# Patient Record
Sex: Female | Born: 1965 | Race: Asian | Hispanic: No | Marital: Married | Smoking: Never smoker
Health system: Southern US, Community
[De-identification: ages and names within clinical notes are randomized; demographics above are authoritative.]

---

## 2007-12-15 ENCOUNTER — Ambulatory Visit: Payer: Self-pay | Admitting: Internal Medicine

## 2007-12-15 DIAGNOSIS — R109 Unspecified abdominal pain: Secondary | ICD-10-CM | POA: Insufficient documentation

## 2008-03-14 ENCOUNTER — Ambulatory Visit: Payer: Self-pay | Admitting: Internal Medicine

## 2008-03-14 DIAGNOSIS — K219 Gastro-esophageal reflux disease without esophagitis: Secondary | ICD-10-CM | POA: Insufficient documentation

## 2008-12-12 ENCOUNTER — Other Ambulatory Visit: Admission: RE | Admit: 2008-12-12 | Discharge: 2008-12-12 | Payer: Self-pay | Admitting: Family Medicine

## 2010-07-23 ENCOUNTER — Other Ambulatory Visit (HOSPITAL_COMMUNITY)
Admission: RE | Admit: 2010-07-23 | Discharge: 2010-07-23 | Disposition: A | Discharge status: Home / Self Care | Payer: BC Managed Care – PPO | Source: Ambulatory Visit | Attending: Family Medicine | Admitting: Family Medicine

## 2010-07-23 DIAGNOSIS — Z124 Encounter for screening for malignant neoplasm of cervix: Secondary | ICD-10-CM | POA: Insufficient documentation

## 2012-12-23 ENCOUNTER — Other Ambulatory Visit: Payer: Self-pay

## 2012-12-23 DIAGNOSIS — Z1231 Encounter for screening mammogram for malignant neoplasm of breast: Secondary | ICD-10-CM

## 2013-01-20 ENCOUNTER — Ambulatory Visit
Admission: RE | Admit: 2013-01-20 | Discharge: 2013-01-20 | Disposition: A | Discharge status: Home / Self Care | Payer: Managed Care, Other (non HMO) | Source: Ambulatory Visit

## 2013-01-20 DIAGNOSIS — Z1231 Encounter for screening mammogram for malignant neoplasm of breast: Secondary | ICD-10-CM

## 2013-10-04 ENCOUNTER — Other Ambulatory Visit (HOSPITAL_COMMUNITY)
Admission: RE | Admit: 2013-10-04 | Discharge: 2013-10-04 | Disposition: A | Discharge status: Home / Self Care | Payer: BC Managed Care – PPO | Source: Ambulatory Visit | Attending: Family Medicine | Admitting: Family Medicine

## 2013-10-04 ENCOUNTER — Other Ambulatory Visit: Payer: Self-pay | Admitting: Family Medicine

## 2013-10-04 DIAGNOSIS — Z124 Encounter for screening for malignant neoplasm of cervix: Secondary | ICD-10-CM | POA: Insufficient documentation

## 2013-10-05 LAB — CYTOLOGY - PAP

## 2013-10-20 ENCOUNTER — Other Ambulatory Visit: Payer: Self-pay | Admitting: Obstetrics & Gynecology

## 2015-01-02 ENCOUNTER — Telehealth: Payer: Self-pay | Admitting: Hematology

## 2015-01-02 NOTE — Telephone Encounter (Signed)
CALLED PT LEFT VM IN REF TO NP APPT. °

## 2015-01-13 ENCOUNTER — Telehealth: Payer: Self-pay | Admitting: Oncology

## 2015-01-13 NOTE — Telephone Encounter (Signed)
Spoke with pt regarding new pt referral pt will call back to schedule

## 2015-11-22 ENCOUNTER — Other Ambulatory Visit: Payer: Self-pay | Admitting: Family Medicine

## 2015-11-22 DIAGNOSIS — Z1231 Encounter for screening mammogram for malignant neoplasm of breast: Secondary | ICD-10-CM

## 2015-12-13 ENCOUNTER — Ambulatory Visit: Payer: Managed Care, Other (non HMO)

## 2016-01-16 ENCOUNTER — Ambulatory Visit
Admission: RE | Admit: 2016-01-16 | Discharge: 2016-01-16 | Disposition: A | Discharge status: Home / Self Care | Payer: BLUE CROSS/BLUE SHIELD | Source: Ambulatory Visit | Attending: Family Medicine | Admitting: Family Medicine

## 2016-01-16 DIAGNOSIS — Z1231 Encounter for screening mammogram for malignant neoplasm of breast: Secondary | ICD-10-CM

## 2016-01-17 ENCOUNTER — Other Ambulatory Visit: Payer: Self-pay | Admitting: Family Medicine

## 2016-01-17 DIAGNOSIS — R928 Other abnormal and inconclusive findings on diagnostic imaging of breast: Secondary | ICD-10-CM

## 2016-01-24 ENCOUNTER — Ambulatory Visit
Admission: RE | Admit: 2016-01-24 | Discharge: 2016-01-24 | Disposition: A | Discharge status: Home / Self Care | Payer: BLUE CROSS/BLUE SHIELD | Source: Ambulatory Visit | Attending: Family Medicine | Admitting: Family Medicine

## 2016-01-24 ENCOUNTER — Other Ambulatory Visit: Payer: Self-pay | Admitting: Family Medicine

## 2016-01-24 DIAGNOSIS — R928 Other abnormal and inconclusive findings on diagnostic imaging of breast: Secondary | ICD-10-CM

## 2016-01-24 DIAGNOSIS — R599 Enlarged lymph nodes, unspecified: Secondary | ICD-10-CM

## 2016-01-24 DIAGNOSIS — N6489 Other specified disorders of breast: Secondary | ICD-10-CM

## 2016-01-30 ENCOUNTER — Other Ambulatory Visit: Payer: Self-pay | Admitting: Family Medicine

## 2016-01-30 DIAGNOSIS — R599 Enlarged lymph nodes, unspecified: Secondary | ICD-10-CM

## 2016-01-31 ENCOUNTER — Ambulatory Visit
Admission: RE | Admit: 2016-01-31 | Discharge: 2016-01-31 | Disposition: A | Discharge status: Home / Self Care | Payer: BLUE CROSS/BLUE SHIELD | Source: Ambulatory Visit | Attending: Family Medicine | Admitting: Family Medicine

## 2016-01-31 ENCOUNTER — Other Ambulatory Visit: Payer: Self-pay | Admitting: Family Medicine

## 2016-01-31 DIAGNOSIS — N6489 Other specified disorders of breast: Secondary | ICD-10-CM

## 2016-01-31 DIAGNOSIS — R599 Enlarged lymph nodes, unspecified: Secondary | ICD-10-CM

## 2016-02-02 ENCOUNTER — Inpatient Hospital Stay: Admission: RE | Admit: 2016-02-02 | Payer: BLUE CROSS/BLUE SHIELD | Source: Ambulatory Visit

## 2016-02-06 ENCOUNTER — Ambulatory Visit
Admission: RE | Admit: 2016-02-06 | Discharge: 2016-02-06 | Disposition: A | Discharge status: Home / Self Care | Payer: BLUE CROSS/BLUE SHIELD | Source: Ambulatory Visit | Attending: Family Medicine | Admitting: Family Medicine

## 2016-02-06 DIAGNOSIS — R599 Enlarged lymph nodes, unspecified: Secondary | ICD-10-CM

## 2016-02-14 ENCOUNTER — Other Ambulatory Visit: Payer: Self-pay | Admitting: Surgery

## 2016-02-15 ENCOUNTER — Other Ambulatory Visit: Payer: Self-pay | Admitting: Surgery

## 2016-02-15 DIAGNOSIS — C50919 Malignant neoplasm of unspecified site of unspecified female breast: Secondary | ICD-10-CM

## 2016-02-16 ENCOUNTER — Telehealth: Payer: Self-pay | Admitting: Oncology

## 2016-02-16 ENCOUNTER — Encounter: Payer: Self-pay | Admitting: Oncology

## 2016-02-16 NOTE — Telephone Encounter (Signed)
Pt cld to schedule an appt w/Dr. Jana Hakim for 1/15 at 4pm. Pt agreed to the appt date and time. Demographics verified. Letter mailed.

## 2016-02-18 ENCOUNTER — Ambulatory Visit
Admission: RE | Admit: 2016-02-18 | Discharge: 2016-02-18 | Disposition: A | Discharge status: Home / Self Care | Payer: BLUE CROSS/BLUE SHIELD | Source: Ambulatory Visit | Attending: Surgery | Admitting: Surgery

## 2016-02-18 ENCOUNTER — Encounter: Payer: Self-pay | Admitting: Radiology

## 2016-02-18 DIAGNOSIS — C50919 Malignant neoplasm of unspecified site of unspecified female breast: Secondary | ICD-10-CM

## 2016-02-18 MED ORDER — GADOBENATE DIMEGLUMINE 529 MG/ML IV SOLN
14.0000 mL | Freq: Once | INTRAVENOUS | Status: AC | PRN
  Administered 2016-02-18: 14 mL via INTRAVENOUS

## 2016-02-21 ENCOUNTER — Other Ambulatory Visit: Payer: Self-pay | Admitting: Surgery

## 2016-02-21 DIAGNOSIS — R928 Other abnormal and inconclusive findings on diagnostic imaging of breast: Secondary | ICD-10-CM

## 2016-02-22 ENCOUNTER — Telehealth: Payer: Self-pay | Admitting: *Deleted

## 2016-02-22 NOTE — Telephone Encounter (Signed)
error 

## 2016-02-22 NOTE — Telephone Encounter (Signed)
Received appt date/time from Andrea.  Mailed new pt packet to pt.  

## 2016-02-23 ENCOUNTER — Other Ambulatory Visit: Payer: Self-pay | Admitting: *Deleted

## 2016-02-23 DIAGNOSIS — C50919 Malignant neoplasm of unspecified site of unspecified female breast: Secondary | ICD-10-CM

## 2016-02-26 ENCOUNTER — Other Ambulatory Visit: Payer: Self-pay | Admitting: *Deleted

## 2016-02-26 ENCOUNTER — Encounter: Payer: Self-pay | Admitting: *Deleted

## 2016-02-26 ENCOUNTER — Other Ambulatory Visit (HOSPITAL_BASED_OUTPATIENT_CLINIC_OR_DEPARTMENT_OTHER): Payer: BLUE CROSS/BLUE SHIELD

## 2016-02-26 ENCOUNTER — Ambulatory Visit (HOSPITAL_BASED_OUTPATIENT_CLINIC_OR_DEPARTMENT_OTHER): Payer: BLUE CROSS/BLUE SHIELD | Admitting: Oncology

## 2016-02-26 DIAGNOSIS — C50512 Malignant neoplasm of lower-outer quadrant of left female breast: Secondary | ICD-10-CM

## 2016-02-26 DIAGNOSIS — C50912 Malignant neoplasm of unspecified site of left female breast: Secondary | ICD-10-CM | POA: Insufficient documentation

## 2016-02-26 DIAGNOSIS — Z170 Estrogen receptor positive status [ER+]: Secondary | ICD-10-CM

## 2016-02-26 DIAGNOSIS — C50919 Malignant neoplasm of unspecified site of unspecified female breast: Secondary | ICD-10-CM

## 2016-02-26 DIAGNOSIS — C773 Secondary and unspecified malignant neoplasm of axilla and upper limb lymph nodes: Secondary | ICD-10-CM

## 2016-02-26 LAB — COMPREHENSIVE METABOLIC PANEL
ALBUMIN: 4.1 g/dL (ref 3.5–5.0)
ALK PHOS: 89 U/L (ref 40–150)
ALT: 17 U/L (ref 0–55)
AST: 12 U/L (ref 5–34)
Anion Gap: 8 mEq/L (ref 3–11)
BILIRUBIN TOTAL: 0.54 mg/dL (ref 0.20–1.20)
BUN: 9.8 mg/dL (ref 7.0–26.0)
CO2: 29 mEq/L (ref 22–29)
CREATININE: 0.7 mg/dL (ref 0.6–1.1)
Calcium: 9.8 mg/dL (ref 8.4–10.4)
Chloride: 105 mEq/L (ref 98–109)
GLUCOSE: 105 mg/dL (ref 70–140)
Potassium: 4.7 mEq/L (ref 3.5–5.1)
SODIUM: 141 meq/L (ref 136–145)
TOTAL PROTEIN: 7 g/dL (ref 6.4–8.3)

## 2016-02-26 LAB — CBC WITH DIFFERENTIAL/PLATELET
BASO%: 0.7 % (ref 0.0–2.0)
Basophils Absolute: 0 10*3/uL (ref 0.0–0.1)
EOS ABS: 0.1 10*3/uL (ref 0.0–0.5)
EOS%: 1.7 % (ref 0.0–7.0)
HCT: 42 % (ref 34.8–46.6)
HEMOGLOBIN: 13.8 g/dL (ref 11.6–15.9)
LYMPH%: 33.8 % (ref 14.0–49.7)
MCH: 29.1 pg (ref 25.1–34.0)
MCHC: 32.9 g/dL (ref 31.5–36.0)
MCV: 88.6 fL (ref 79.5–101.0)
MONO#: 0.4 10*3/uL (ref 0.1–0.9)
MONO%: 6.8 % (ref 0.0–14.0)
NEUT%: 57 % (ref 38.4–76.8)
NEUTROS ABS: 3.6 10*3/uL (ref 1.5–6.5)
RBC: 4.73 10*6/uL (ref 3.70–5.45)
RDW: 14.4 % (ref 11.2–14.5)
WBC: 6.3 10*3/uL (ref 3.9–10.3)
lymph#: 2.1 10*3/uL (ref 0.9–3.3)

## 2016-02-26 LAB — TECHNOLOGIST REVIEW

## 2016-02-26 NOTE — Progress Notes (Signed)
START ON PATHWAY REGIMEN - Breast  BOS235: Docetaxel  + Carboplatin + Trastuzumab + Pertuzumab (TCHP) x 6 Cycles then Continue Trastuzumab Maintenance q21 Days Post-Surgery x 11 Doses to Complete One Year of Therapy  Docetaxel  + Carboplatin + Trastuzumab + Pertuzumab (TCHP) q21 Days:   A cycle is every 21 days:     Pertuzumab (Perjeta(R)) 840 mg flat dose IV in 250 mL NS over 60 minutes as a loading dose cycle 1 only.  See observation note below. Dose Mod: None     Pertuzumab (Perjeta(R)) 420 mg flat dose IV in 250 mL NS over 30 minutes as maintenance dose cycles 2 and beyond. An observation period of 30-60 minutes is recommended after each dose prior to subsequent infusion of trastuzumab or docetaxel. Dose Mod: None     Trastuzumab (Herceptin(R)) 8 mg/kg in 250 mL NS IV over 90 minutes as a loading dose cycle 1 only. Dose Mod: None     Trastuzumab (Herceptin(R)) 6 mg/kg in 250 mL NS IV over 30 minutes as maintenance dose cycle 2 and beyond. Dose Mod: None     Carboplatin (Paraplatin(R)) AUC=6 in a total of 250 mL NS IV over 1 hour. Cited: TRYPHAENA trial gave carboplatin prior to docetaxel. Dose Mod: None     Docetaxel (Taxotere(R)) 75 mg/m2 in 250 mL NS IV over 1 hour.  Docetaxel should be given after pertuzumab and trastuzumab Dose Mod: None Additional Orders: Premedicate with dexamethasone 8 mg PO bid for three days beginning 1 day prior to docetaxel therapy. Recommended monitoring: LVEF baseline and q3-4 months or with the development of cardiac symptoms and regimen changes for metastatic treatment. Schneeweiss, et al. Lelon Frohlich Oncol. 2013 Sep;24(9):2278-84  **Always confirm dose/schedule in your pharmacy ordering system**    Trastuzumab (Maintenance - NO Loading Dose):   A cycle is every 21 days:     Trastuzumab (Herceptin(R)) 6 mg/kg in 250 mL NS IV over 90 minutes.  If tolerated, maintenance dose may be given over 30 mins. Recommended Monitoring: LVEF q3-4 months for adjuvant  treatment, or with the development of cardiac symptoms and regimen  changes for metastatic treatment. Dose Mod: None  **Always confirm dose/schedule in your pharmacy ordering system**    Patient Characteristics: Neoadjuvant Chemotherapy, HER2/neu Positive, ER Positive AJCC Stage Grouping: Unknown Current Disease Status: No Distant Mets or Local Recurrence AJCC M Stage: 0 ER Status: Positive (+) AJCC N Stage: 1a AJCC T Stage: X HER2/neu: Positive (+) PR Status: Negative (-)  Intent of Therapy: Curative Intent, Discussed with Patient

## 2016-02-26 NOTE — Progress Notes (Signed)
[facility]  [phone] [phone]     ID: Jacqueline Avery DOB: 12/02/1965  MR#: [id number]  CSN#:[id number]  Patient Care Team: Donna Gates, MD as PCP - General (Family Medicine) Douglas Blackman, MD as Consulting Physician (General Surgery)  C , MD as Consulting Physician (Oncology) , C, MD OTHER MD:  CHIEF COMPLAINT: HER-2 positive breast cancer  CURRENT TREATMENT: Neoadjuvant chemotherapy   BREAST CANCER HISTORY: Jacqueline Avery had bilateral screening mammography with tomography at [facility] 01/16/2016 showing an area of possible distortion in the left breast and possibly an enlarged left axillary lymph nodes. She was recalled for diagnostic mammography with tomography of the left breast and left breast ultrasonography 01/24/2016. The breast density was category C. In the central left breast there was an area of distortion and in the left axilla there was at least 1 lymph node with a thickened cortex. Ultrasonography found no correlate to the area of distortion in the left breast. A separate area of mixed echogenicity at 5:00 4 cm from the nipple was indeterminate. However ultrasound did show several enlarged and abnormal lymph nodes the largest measuring 1.6 cm.  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 The lymph node however was positive for metastatic carcinoma, positive for cytokeratin 7, gross cystic disease fluid protein, and e-cadherin, with very few estrogen receptor positive cells. ER was read as 5% positive with moderate staining intensity, progesterone receptor was negative, and the proliferation marker was 60%. HER-2 however was amplified the signals ratio being 5.46 and the number per cell 6.55.  On 02/18/2016 the patient underwent bilateral breast MRI, showing an indeterminate area of masslike enhancement in the upper outer left breast.  She is cheduled for his second MRI biopsy 02/27/2016.  Her subsequent history is as detailed below  INTERVAL HISTORY: Jacqueline Avery was evaluated in the breast clinic 02/26/2016 accompanied by her husband. Her case was also presented in the multidisciplinary breast cancer conference 02/14/2016. At that time the prognostic panel was not available and a definitive plan. Could not be proposed.  REVIEW OF SYSTEMS: There were no specific symptoms leading to the original mammogram, which was routinely scheduled. The patient denies unusual headaches, visual changes, nausea, vomiting, stiff neck, dizziness, or gait imbalance. There has been no cough, phlegm production, or pleurisy, no chest pain or pressure, and no change in bowel or bladder habits. The patient denies fever, rash, bleeding, unexplained fatigue or unexplained weight loss. Jacqueline Avery exercises regularly mostly by swimming, but she also is good at tenderness. She enjoys taking walks in good weather. A detailed review of systems was otherwise entirely negative.   PAST MEDICAL HISTORY: No past medical history on file.  PAST SURGICAL HISTORY: No past surgical history on file.  FAMILY HISTORY No family history on file.  The patient's father died from heart disease at the age of 77. The patient's mother is living as of January 2018, in her early 70s. The patient has one brother, 2 sisters. There is no history of breast or ovarian cancer in the family to the patient's knowledge  GYNECOLOGIC HISTORY:  Patient's last menstrual period was 12/16/2015. Menarche age 12, first live birth age 24, she is GX P2. Her periods are now irregular, her most recent one being October 2017. However she is not experiencing any menopausal symptoms.  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 He works in [place] however. One of   the patient's children is studying medicine at the [employer].    ADVANCED DIRECTIVES: Not in place   HEALTH  MAINTENANCE: Social History  Substance Use Topics  . Smoking status: Not on file  . Smokeless tobacco: Not on file  . Alcohol use Not on file     Colonoscopy: n/a  PAP:  Bone density: n/a   No Known Allergies  Current Outpatient Prescriptions  Medication Sig Dispense Refill  . b complex vitamins capsule Take 1 capsule by mouth daily.    . calcium carbonate (TUMS - DOSED IN MG ELEMENTAL CALCIUM) 500 MG chewable tablet Chew 1 tablet by mouth daily.    . cholecalciferol (VITAMIN D) 1000 units tablet Take 1,000 Units by mouth daily.    . TURMERIC PO Take by mouth.     No current facility-administered medications for this visit.     OBJECTIVE: Middle-aged East Asian woman who appears well Vitals:   02/26/16 1604  BP: 127/68  Pulse: 63  Resp: 20  Temp: 97.4 F (36.3 C)     Body mass index is 25.15 kg/m.    ECOG FS:0 - Asymptomatic  Ocular: Sclerae unicteric, pupils equal, round and reactive to light Ear-nose-throat: Oropharynx clear and moist Lymphatic: No cervical or supraclavicular adenopathy Lungs no rales or rhonchi, good excursion bilaterally Heart regular rate and rhythm, no murmur appreciated Abd soft, nontender, positive bowel sounds MSK no focal spinal tenderness, no joint edema Neuro: non-focal, well-oriented, appropriate affect Breasts: The right breast is unremarkable. I do not palpate a mass in the left breast and I do not palpate any axillary adenopathy.   LAB RESULTS:  CMP     Component Value Date/Time   NA 141 02/26/2016 1552   K 4.7 02/26/2016 1552   CO2 29 02/26/2016 1552   GLUCOSE 105 02/26/2016 1552   BUN 9.8 02/26/2016 1552   CREATININE 0.7 02/26/2016 1552   CALCIUM 9.8 02/26/2016 1552   PROT 7.0 02/26/2016 1552   ALBUMIN 4.1 02/26/2016 1552   AST 12 02/26/2016 1552   ALT 17 02/26/2016 1552   ALKPHOS 89 02/26/2016 1552   BILITOT 0.54 02/26/2016 1552    INo results found for: SPEP, UPEP  Lab Results  Component Value Date   WBC 6.3  02/26/2016   NEUTROABS 3.6 02/26/2016   HGB 13.8 02/26/2016   HCT 42.0 02/26/2016   MCV 88.6 02/26/2016   PLT 123 Large & giant platelets (L) 02/26/2016      Chemistry      Component Value Date/Time   NA 141 02/26/2016 1552   K 4.7 02/26/2016 1552   CO2 29 02/26/2016 1552   BUN 9.8 02/26/2016 1552   CREATININE 0.7 02/26/2016 1552      Component Value Date/Time   CALCIUM 9.8 02/26/2016 1552   ALKPHOS 89 02/26/2016 1552   AST 12 02/26/2016 1552   ALT 17 02/26/2016 1552   BILITOT 0.54 02/26/2016 1552       No results found for: LABCA2  No components found for: LABCA125  No results for input(s): INR in the last 168 hours.  Urinalysis No results found for: COLORURINE, APPEARANCEUR, LABSPEC, PHURINE, GLUCOSEU, HGBUR, BILIRUBINUR, KETONESUR, PROTEINUR, UROBILINOGEN, NITRITE, LEUKOCYTESUR   STUDIES: Mr Breast Bilateral W Wo Contrast  Addendum Date: 02/21/2016   ADDENDUM REPORT: 02/21/2016 14:07 ADDENDUM: The patient's clinical history should state that biopsy of an enlarged left axillary lymph node was positive for metastatic carcinoma of breast or salivary primary origin. Electronically Signed   By:   Erin  Shaw M.D.   On: 02/21/2016 14:07   Result Date: 02/21/2016 CLINICAL DATA:  50-year-old female with biopsy of a left breast distortion revealing a complex sclerosing lesion and lobular carcinoma in situ. Biopsy of an enlarged left axillary lymph node was positive for metastatic carcinoma of breast or solitary primary origin. LABS:  No labs were performed at the imaging center today. EXAM: BILATERAL BREAST MRI WITH AND WITHOUT CONTRAST TECHNIQUE: Multiplanar, multisequence MR images of both breasts were obtained prior to and following the intravenous administration of 14 ml of MultiHance. THREE-DIMENSIONAL MR IMAGE RENDERING ON INDEPENDENT WORKSTATION: Three-dimensional MR images were rendered by post-processing of the original MR data on an independent workstation. The  three-dimensional MR images were interpreted, and findings are reported in the following complete MRI report for this study. Three dimensional images were evaluated at the independent DynaCad workstation COMPARISON:  Previous exam(s). FINDINGS: Breast composition: c. Heterogeneous fibroglandular tissue. Background parenchymal enhancement: Moderate. Right breast: No dominant mass or suspicious enhancement. There is a background of multiple bilateral foci of enhancement. Left breast: Artifact from the X shaped biopsy marker is seen within the lower, outer left breast on series 3, image 171. Non mass enhancement surrounds the biopsy site measuring 2.5 x 1.3 x 1.9 cm (TR x AP x CC). Within the posterior, upper, outer left breast, there is focal non mass enhancement measuring 8 x 6 x 5 mm with associated washout kinetics (series 6, image 132). There is a background of multiple bilateral foci of enhancement. Lymph nodes: Multiple abnormal left axillary lymph nodes including the biopsied 1.7 cm lymph node seen on series 5, image 67 containing biopsy marker artifact. No suspicious appearing right axillary lymph nodes. Ancillary findings:  None. IMPRESSION: 1. Indeterminate focal area of non mass enhancement within the posterior upper, outer left breast. 2. Non mass enhancement surrounds the core biopsy site demonstrating a complex sclerosing lesion and lobular carcinoma in situ within the inferior left breast. 3. Left axillary adenopathy consistent with the known biopsy proven metastatic axillary disease. RECOMMENDATION: 1. MRI guided biopsy of the focal non mass enhancement within the posterior upper, outer left breast. 2. Treatment plan for the known left axillary metastatic disease. Surgical excision of the left breast biopsy site demonstrating a complex sclerosing lesion and lobular carcinoma in situ. BI-RADS CATEGORY  4: Suspicious. Electronically Signed: By: Erin  Shaw M.D. On: 02/19/2016 17:55   Mm Radiologist Eval  And Mgmt  Result Date: 02/06/2016 CONSULTATION: Patient Name: Avery, Jacqueline X Patient DOB:   11/19/1965 Age: 50 y/o MRN: [id number] Patient's Current Pain Level: 1-2 Dear Dr. DONNA GATES ; Your patient, Mrs. Jacqueline Avery, returned today for consultation regarding her biopsy results. I initially discussed the results from her breast biopsy, which revealed a complex sclerosing lesion, benign pathology which is considered a high risk lesion warranting surgical excision. I then discussed the biopsy of her axillary lymph node, which revealed carcinoma thought to be either of breast or salivary gland origin. Pathology did mention weak estrogen receptor positivity, which would support a breast primary, as does the location in the left axilla. The patient and her family had numerous questions concerning the cancer diagnosis. These were all answered to the best of my ability. I told the patient that she would be seen by a breast surgeon, Dr. Blackman, next week. I explained that she would need the complex sclerosing lesion resected, but given the carcinoma in the left axillary lymph node, I also explained that she may benefit from   breast MRI, as a problem-solving study to evaluate for possible breast carcinoma, prior to her surgical excision of the complex sclerosing lesion. The patient was told that she would also be referred to an oncologist as part of her evaluation and subsequent treatment. Thank you for referring your patient to the [facility], Sincerely, Lasandra Beech, MD Electronically Signed   By: Lajean Manes M.D.   On: 02/06/2016 15:44   Mm Clip Placement Left  Result Date: 01/31/2016 CLINICAL DATA:  Status post tomosynthesis guided biopsy of a distortion within the inferior left breast and ultrasound-guided biopsy of an enlarged left axillary lymph node EXAM: 3D DIAGNOSTIC LEFT MAMMOGRAM POST ULTRASOUND AND TOMOSYNTHESIS GUIDED BIOPSY COMPARISON:  Previous exam(s). FINDINGS: 3D Mammographic images were obtained  following tomosynthesis guided biopsy of a distortion within the inferior left breast and ultrasound-guided biopsy of an enlarged left axillary lymph node. Post biopsy mammogram demonstrates the X shaped biopsy marker in the expected location along the posterior margin of the biopsy cavity within the inferior left breast. A spiral shaped biopsy marker projects over the expected location of the enlarged axillary lymph node. IMPRESSION: Appropriate marker position as above. Final Assessment: Post Procedure Mammograms for Marker Placement Electronically Signed   By: Pamelia Hoit M.D.   On: 01/31/2016 10:06   Mm Lt Breast Bx W Loc Dev 1st Lesion Image Bx Spec Stereo Guide  Result Date: 01/31/2016 CLINICAL DATA:  51 year old female with indeterminate left breast distortion EXAM: LEFT BREAST STEREOTACTIC CORE NEEDLE BIOPSY COMPARISON:  Previous exams. FINDINGS: The patient and I discussed the procedure of tomosynthesis/stereotactic-guided biopsy including benefits and alternatives. We discussed the high likelihood of a successful procedure. We discussed the risks of the procedure including infection, bleeding, tissue injury, clip migration, and inadequate sampling. Informed written consent was given. The usual time out protocol was performed immediately prior to the procedure. Using sterile technique and 1% Lidocaine as local anesthetic, under stereotactic guidance, a 9 gauge vacuum assist device was used to perform core needle biopsy of a distortion in the inferior left breast using a lateral to medial approach. Specimen radiograph was performed. At the conclusion of the procedure, an X shaped tissue marker clip was deployed into the biopsy cavity. Follow-up 2-view mammogram was performed and dictated separately. IMPRESSION: Stereotactic/ tomosynthesis -guided biopsy of an indeterminate left breast distortion. No apparent complications. Electronically Signed   By: Pamelia Hoit M.D.   On: 01/31/2016 09:55   [place] Lt  Breast Bx W Loc Dev 1st Lesion Img Bx Spec US Guide  Result Date: 01/31/2016 CLINICAL DATA:  51 year old female for ultrasound-guided biopsy of an enlarged left axillary lymph node EXAM: ULTRASOUND GUIDED CORE NEEDLE BIOPSY OF A LEFT AXILLARY NODE COMPARISON:  Previous exam(s). FINDINGS: I met with the patient and we discussed the procedure of ultrasound-guided biopsy, including benefits and alternatives. We discussed the high likelihood of a successful procedure. We discussed the risks of the procedure, including infection, bleeding, tissue injury, clip migration, and inadequate sampling. Informed written consent was given. The usual time-out protocol was performed immediately prior to the procedure. Using sterile technique and 1% Lidocaine as local anesthetic, under direct ultrasound visualization, a 14 gauge spring-loaded device was used to perform biopsy of an enlarged left axillary lymph node using a lateral to medial approach. At the conclusion of the procedure a spiral shaped tissue marker clip was deployed into the biopsy cavity. Follow up 2 view mammogram was performed and dictated separately. IMPRESSION: Ultrasound guided biopsy of an enlarged  left axillary lymph node. No apparent complications. Electronically Signed   By: Pamelia Hoit M.D.   On: 01/31/2016 09:52    ELIGIBLE FOR AVAILABLE RESEARCH PROTOCOL: no  ASSESSMENT: 51 y.o. [place] woman status post left axillary lymph node biopsy 01/31/2016 showing invasive ductal carcinoma, high-grade, E-cadherin positive, weakly estrogen receptor positive at 5%, progesterone receptor negative, with an MIB-1 of 60%, but HER-2 amplified, the signals ratio being 5.46 and the number per cell 6.55.  (a) biopsy of a left breast lower outer quadrant irregularity 01/31/2016 showed a sclerosing lesion and LCIS  (b) biopsy of a left breast upper outer quadrant irregularity pending 02/27/2016  (1) neoadjuvant chemotherapy will consist of carboplatin, docetaxel,  trastuzumab and pertuzumab given every 21 days for 6 cycles  (2) trastuzumab and pertuzumab to be continued to complete a year  (3) definitive surgery at the completion of chemotherapy  (4) adjuvant radiation to follow as appropriate  (5) consider anti-estrogens at the completion of local therapy   PLAN: We spent the better part of today's hour-long appointment discussing the biology of breast cancer in general, and the specifics of the patient's tumor in particular. We first reviewed the fact that cancer is not one disease but more than 100 different diseases and that it is important to keep them separate-- otherwise when friends and relatives discuss their own cancer experiences with Camarie  confusion can result. Similarly we discussed that if breast cancer spreads to the bones, lungs or liver, Jacqueline Avery  would not have bone cancer or liver cancer, but breast cancer in the bone and breast cancer in the liver-- one cancer in three places-- not three different cancers, which otherwise would have to be treated in 3 different ways.  We emphasized the difference between local and systemic therapies. In terms of loco/regional treatment, lumpectomy plus radiation is equivalent to mastectomy as far as survival is concerned. We also noted that in terms of sequencing of treatments, whether systemic therapy or surgery is done first does not affect the ultimate outcome. This is relevant to Jacqueline Avery's situation  We then reviewed systemic therapy options. These include anti-estrogens, anti-HER-2 treatment, and chemotherapy. She is a poor candidate for anti-estrogen treatment and even though she may develop some degree of benefit from anti-estrogens clearly this cannot be the main form of therapy in her situation.  On the other hand she is a very good candidate for anti-HER-2 treatment, especially given neoadjuvantly. Anti-HER-2 treatment in this setting is generally given with chemotherapy. A high percentage of  patients like Jacqueline Avery will achieve a complete pathologic response with this neo-adjuvant chemo/immunotherapy. This means when the surgeon removes the area of cancer, no cancer would be seen by pathology in the breast or lymph nodes. Patients who achieve a complete pathologic response have an unusually favorable long term prognosis.  Accordingly we recommend starting with systemic treatment in Jacqueline Avery's case. She knows the best proven therapy in this situation is carboplatin, docetaxel, trastuzumab, and pertuzumab given every 21 days 6, followed by continuing trastuzumab and pertuzumab to complete one year. We discussed the possible toxicities, side effects and complications of these treatments.  Jacqueline Avery is being set up for an echocardiogram and a visit with cardio oncology as well as our "chemotherapy school". She will have a port placed within the next week or so. She has already been set up for biopsy of a second suspicious area in the left breast and she knows this will be important for the final surgical plan. Since she may  be stage III, she will be staged also with a CT scan of the chest and a bone scan. She will meet with me 03/08/2016 to discuss anti-emetics and other supportive measures and we hope to start her chemotherapy on 03/12/2016    Jacqueline Avery has requested a second opinion on her pathology. She understands that her pathology was reviewed by a second pathologist year, namely Dr. kelly nevertheless she would like a third opinion at [facility] and we will request that be done. I also offered to discuss her situation directly with her son if he wishes to contact me.  Jacqueline Avery has a good understanding of the overall plan. She agrees with it. She knows the goal of treatment in her case is cure. She will call with any problems that may develop before her next visit here.  , C, MD   02/26/2016 9:16 PM Medical Oncology and Hematology [facility] [address] [place], [place]  [address] Tel. [phone]    Fax. [phone]   

## 2016-02-27 ENCOUNTER — Ambulatory Visit
Admission: RE | Admit: 2016-02-27 | Discharge: 2016-02-27 | Disposition: A | Discharge status: Home / Self Care | Payer: BLUE CROSS/BLUE SHIELD | Source: Ambulatory Visit | Attending: Surgery | Admitting: Surgery

## 2016-02-27 ENCOUNTER — Other Ambulatory Visit: Payer: Self-pay | Admitting: Surgery

## 2016-02-27 DIAGNOSIS — R928 Other abnormal and inconclusive findings on diagnostic imaging of breast: Secondary | ICD-10-CM

## 2016-03-02 ENCOUNTER — Telehealth: Payer: Self-pay

## 2016-03-02 NOTE — Telephone Encounter (Signed)
Spoke with patient and she is aware of her chemo class and dr Jana Hakim.  Per the los she should be seen at 11;00 but that time was taken.  She is aware we will see her sooner if possible    Jacqueline Avery

## 2016-03-04 ENCOUNTER — Encounter: Payer: Self-pay | Admitting: *Deleted

## 2016-03-04 ENCOUNTER — Telehealth: Payer: Self-pay

## 2016-03-04 NOTE — Telephone Encounter (Signed)
Pt called for clarification of where her bone scan and CT are taking place tomorrow. She also clarified when she will be NPO.

## 2016-03-05 ENCOUNTER — Encounter (HOSPITAL_COMMUNITY)
Admission: RE | Admit: 2016-03-05 | Discharge: 2016-03-05 | Disposition: A | Discharge status: Home / Self Care | Payer: BLUE CROSS/BLUE SHIELD | Source: Ambulatory Visit | Attending: Oncology | Admitting: Oncology

## 2016-03-05 ENCOUNTER — Encounter (HOSPITAL_BASED_OUTPATIENT_CLINIC_OR_DEPARTMENT_OTHER): Payer: Self-pay | Admitting: *Deleted

## 2016-03-05 ENCOUNTER — Ambulatory Visit (HOSPITAL_COMMUNITY)
Admission: RE | Admit: 2016-03-05 | Discharge: 2016-03-05 | Disposition: A | Discharge status: Home / Self Care | Payer: BLUE CROSS/BLUE SHIELD | Source: Ambulatory Visit | Attending: Oncology | Admitting: Oncology

## 2016-03-05 ENCOUNTER — Encounter (HOSPITAL_COMMUNITY): Payer: Self-pay | Admitting: Radiology

## 2016-03-05 DIAGNOSIS — R591 Generalized enlarged lymph nodes: Secondary | ICD-10-CM | POA: Diagnosis not present

## 2016-03-05 DIAGNOSIS — J9811 Atelectasis: Secondary | ICD-10-CM | POA: Diagnosis not present

## 2016-03-05 DIAGNOSIS — C50912 Malignant neoplasm of unspecified site of left female breast: Secondary | ICD-10-CM

## 2016-03-05 DIAGNOSIS — C773 Secondary and unspecified malignant neoplasm of axilla and upper limb lymph nodes: Principal | ICD-10-CM

## 2016-03-05 MED ORDER — IOPAMIDOL (ISOVUE-300) INJECTION 61%
75.0000 mL | Freq: Once | INTRAVENOUS | Status: AC | PRN
  Administered 2016-03-05: 75 mL via INTRAVENOUS

## 2016-03-05 MED ORDER — TECHNETIUM TC 99M MEDRONATE IV KIT
25.0000 | PACK | Freq: Once | INTRAVENOUS | Status: AC | PRN
  Administered 2016-03-05: 20.4 via INTRAVENOUS

## 2016-03-06 ENCOUNTER — Other Ambulatory Visit: Payer: Self-pay | Admitting: *Deleted

## 2016-03-06 ENCOUNTER — Telehealth: Payer: Self-pay | Admitting: *Deleted

## 2016-03-06 ENCOUNTER — Telehealth: Payer: Self-pay | Admitting: Medical Oncology

## 2016-03-06 DIAGNOSIS — C50912 Malignant neoplasm of unspecified site of left female breast: Secondary | ICD-10-CM

## 2016-03-06 NOTE — Telephone Encounter (Signed)
Calling re second opinion and results of recent cans. Messaged Dr Magrinat's nurse

## 2016-03-06 NOTE — Telephone Encounter (Signed)
Spoke to pt concerning staging scan results. Pt request an update on having her pathology slides sent for a second opinion. Informed pt she will need to call pathology a [facility] and have them request her pathology slides from our pathology department. Gave pt number to our pathology department for [facility] to call as well as the fax number for a faxed request. Received verbal understanding. Denies further needs at this time.

## 2016-03-07 ENCOUNTER — Encounter: Payer: Self-pay | Admitting: Radiation Oncology

## 2016-03-08 ENCOUNTER — Other Ambulatory Visit (HOSPITAL_COMMUNITY): Payer: Self-pay | Admitting: *Deleted

## 2016-03-08 ENCOUNTER — Ambulatory Visit (HOSPITAL_COMMUNITY)
Admission: RE | Admit: 2016-03-08 | Discharge: 2016-03-08 | Disposition: A | Discharge status: Home / Self Care | Payer: BLUE CROSS/BLUE SHIELD | Source: Ambulatory Visit | Attending: Family Medicine | Admitting: Family Medicine

## 2016-03-08 ENCOUNTER — Other Ambulatory Visit: Payer: Self-pay | Admitting: *Deleted

## 2016-03-08 ENCOUNTER — Ambulatory Visit (HOSPITAL_BASED_OUTPATIENT_CLINIC_OR_DEPARTMENT_OTHER): Payer: BLUE CROSS/BLUE SHIELD | Admitting: Oncology

## 2016-03-08 ENCOUNTER — Other Ambulatory Visit: Payer: BLUE CROSS/BLUE SHIELD

## 2016-03-08 VITALS — BP 113/71 | HR 62 | Temp 97.7°F | Resp 17 | Ht 66.0 in | Wt 149.2 lb

## 2016-03-08 DIAGNOSIS — C50512 Malignant neoplasm of lower-outer quadrant of left female breast: Secondary | ICD-10-CM | POA: Diagnosis not present

## 2016-03-08 DIAGNOSIS — C773 Secondary and unspecified malignant neoplasm of axilla and upper limb lymph nodes: Secondary | ICD-10-CM | POA: Diagnosis not present

## 2016-03-08 DIAGNOSIS — C50311 Malignant neoplasm of lower-inner quadrant of right female breast: Secondary | ICD-10-CM

## 2016-03-08 DIAGNOSIS — C50912 Malignant neoplasm of unspecified site of left female breast: Secondary | ICD-10-CM

## 2016-03-08 LAB — ECHOCARDIOGRAM COMPLETE
Height: 66 in
WEIGHTICAEL: 2387.2 [oz_av]

## 2016-03-08 NOTE — Progress Notes (Signed)
  Echocardiogram 2D Echocardiogram has been performed.  Jacqueline Avery 03/08/2016, 3:04 PM

## 2016-03-08 NOTE — Progress Notes (Signed)
Jacqueline Avery  [phone] [phone]     ID: Jacqueline Avery DOB: 05-Dec-1965  MR#: [id number]  NFA#:[id number]  Patient Care Team: Darcus Austin, MD as PCP - General (Family Medicine) Coralie Keens, MD as Consulting Physician (General Surgery) Chauncey Cruel, MD as Consulting Physician (Oncology) Chauncey Cruel, MD OTHER MD:  CHIEF COMPLAINT: HER-2 positive breast cancer  CURRENT TREATMENT: Neoadjuvant chemotherapy   BREAST CANCER HISTORY: From the original intake note:  Jacqueline Avery had bilateral screening mammography with tomography at the [facility] 01/16/2016 showing an area of possible distortion in the left breast and possibly an enlarged left axillary lymph nodes. She was recalled for diagnostic mammography with tomography of the left breast and left breast ultrasonography 01/24/2016. The breast density was category C. In the central left breast there was an area of distortion and in the left axilla there was at least 1 lymph node with a thickened cortex. Ultrasonography found no correlate to the area of distortion in the left breast. A separate area of mixed echogenicity at 5:00 4 cm from the nipple was indeterminate. However ultrasound did show several enlarged and abnormal lymph nodes the largest measuring 1.6 cm.  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 The lymph node however was positive for metastatic carcinoma, positive for cytokeratin 7, gross cystic disease fluid protein, and e-cadherin, with very few estrogen receptor positive cells. ER was read as 5% positive with moderate staining intensity, progesterone receptor was negative, and the proliferation marker was 60%. HER-2 however was amplified the signals ratio being 5.46 and the number per cell 6.55.  On 02/18/2016 the patient underwent bilateral breast MRI, showing an indeterminate area of masslike  enhancement in the upper outer left breast. She is cheduled for his second MRI biopsy 02/27/2016.  Her subsequent history is as detailed below  INTERVAL HISTORY: [name] returns today for follow-up of her HER-2/neu positive breast cancer. Her husband is back in [place] and could not accompany her. She does not feel she needs a Optometrist. Despite that communication with her is difficult and I have to make sure that she absolutely doesn't understand what I am saying. She is very thorough, has done a lot of research, and has written multiple questions down. I wrote down almost everything we discussed today so she could review it further with her husband over the phone  Since the last visit here she had a bone scan and chest CT scan. There is no evidence of metastatic disease. There are a couple of punctate lesions in her ribs which are most likely not related to cancer but which do need further evaluation  REVIEW OF SYSTEMS: A detailed review of systems today was noncontributory.  PAST MEDICAL HISTORY: No past medical history on file.  PAST SURGICAL HISTORY: No past surgical history on file.  FAMILY HISTORY No family history on file.  The patient's father died from heart disease at the age of 46. The patient's mother is living as of January 2018, in her early 58s. The patient has one brother, 2 sisters. There is no history of breast or ovarian cancer in the family to the patient's knowledge  GYNECOLOGIC HISTORY:  Patient's last menstrual period was 03/05/2016 (exact date). Menarche age 37, first live birth age 46, she is [place] P2. Her periods are now irregular, her most recent one being October 2017. However she is not experiencing any menopausal symptoms.  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 He works in [employer] however. One of the patient's children is studying medicine at the [employer].    ADVANCED DIRECTIVES: Not in place   HEALTH  MAINTENANCE: Social History  Substance Use Topics  . Smoking status: Never Smoker  . Smokeless tobacco: Never Used  . Alcohol use No     Colonoscopy: n/a  PAP:  Bone density: n/a   No Known Allergies  Current Outpatient Prescriptions  Medication Sig Dispense Refill  . b complex vitamins capsule Take 1 capsule by mouth daily.    . Calcium Carbonate-Vitamin D (CALCIUM-VITAMIN D3 PO) Take 1 tablet by mouth daily.    . Cholecalciferol (VITAMIN D3) 5000 units CAPS Take 5,000 Units by mouth daily.    . TURMERIC PO Take 1 capsule by mouth daily.      No current facility-administered medications for this visit.     OBJECTIVE: Middle-aged [place] Asian woman In no acute distress Vitals:   03/08/16 1229  BP: 113/71  Pulse: 62  Resp: 17  Temp: 97.7 F (36.5 C)     Body mass index is 24.08 kg/m.    ECOG FS:0 - Asymptomatic  Sclerae unicteric, EOMs intact Oropharynx clear and moist No cervical or supraclavicular adenopathy Lungs no rales or rhonchi Heart regular rate and rhythm Abd soft, nontender, positive bowel sounds MSK no focal spinal tenderness, no upper extremity lymphedema Neuro: nonfocal, well oriented, appropriate affect Breasts: No masses palpated in either breast. Both axillae are benign.   LAB RESULTS:  CMP     Component Value Date/Time   NA 141 02/26/2016 1552   K 4.7 02/26/2016 1552   CO2 29 02/26/2016 1552   GLUCOSE 105 02/26/2016 1552   BUN 9.8 02/26/2016 1552   CREATININE 0.7 02/26/2016 1552   CALCIUM 9.8 02/26/2016 1552   PROT 7.0 02/26/2016 1552   ALBUMIN 4.1 02/26/2016 1552   AST 12 02/26/2016 1552   ALT 17 02/26/2016 1552   ALKPHOS 89 02/26/2016 1552   BILITOT 0.54 02/26/2016 1552    INo results found for: SPEP, UPEP  Lab Results  Component Value Date   WBC 6.3 02/26/2016   NEUTROABS 3.6 02/26/2016   HGB 13.8 02/26/2016   HCT 42.0 02/26/2016   MCV 88.6 02/26/2016   PLT 123 Large & giant platelets (L) 02/26/2016      Chemistry       Component Value Date/Time   NA 141 02/26/2016 1552   K 4.7 02/26/2016 1552   CO2 29 02/26/2016 1552   BUN 9.8 02/26/2016 1552   CREATININE 0.7 02/26/2016 1552      Component Value Date/Time   CALCIUM 9.8 02/26/2016 1552   ALKPHOS 89 02/26/2016 1552   AST 12 02/26/2016 1552   ALT 17 02/26/2016 1552   BILITOT 0.54 02/26/2016 1552       No results found for: LABCA2  No components found for: LABCA125  No results for input(s): INR in the last 168 hours.  Urinalysis No results found for: COLORURINE, APPEARANCEUR, LABSPEC, PHURINE, GLUCOSEU, HGBUR, BILIRUBINUR, KETONESUR, PROTEINUR, UROBILINOGEN, NITRITE, LEUKOCYTESUR   STUDIES: Ct Chest W Contrast  Result Date: 03/05/2016 CLINICAL DATA:  Left-sided breast cancer. EXAM: CT CHEST WITH CONTRAST TECHNIQUE: Multidetector CT imaging of the chest was performed during intravenous contrast administration. CONTRAST:  19m ISOVUE-300 IOPAMIDOL (ISOVUE-300) INJECTION 61% COMPARISON:  None. FINDINGS: Cardiovascular: The heart size is normal. No pericardial effusion. No thoracic aortic aneurysm. Mediastinum/Nodes: No mediastinal lymphadenopathy. No evidence for  internal mammary lymphadenopathy. There is no hilar lymphadenopathy. No right axillary lymphadenopathy. 13 mm left axillary lymph node is associated with other small left axillary lymph nodes. Lungs/Pleura: Dependent compressive atelectasis noted in the lower lungs bilaterally. No focal airspace consolidation. No pulmonary edema or pleural effusion. Upper Abdomen: No enhancing lesions identified in the visualized portion of the upper liver. Musculoskeletal: Bone windows reveal no worrisome lytic or sclerotic osseous lesions. IMPRESSION: 1. Left axillary lymphadenopathy. No other lymphadenopathy or evidence of metastatic disease in the thorax. Electronically Signed   By: Misty Stanley M.D.   On: 03/05/2016 14:44   Nm Bone Scan Whole Body  Result Date: 03/05/2016 CLINICAL DATA:  Left breast  neoplasm. EXAM: NUCLEAR MEDICINE WHOLE BODY BONE SCAN TECHNIQUE: Whole body anterior and posterior images were obtained approximately 3 hours after intravenous injection of radiopharmaceutical. RADIOPHARMACEUTICALS:  20.4 mCi Technetium-66mMDP IV COMPARISON:  No prior. FINDINGS: Bilateral renal function excretion. Increased activity noted along the left anterior first rib, left rib series suggested for further evaluation. Increased activity noted right posterior fifth rib, this may be the tip of the scapula. Right rib series suggest for further evaluation. No other bony abnormalities identified. IMPRESSION: Punctate area of increased activity noted along the left anterior first rib. Punctate area of increased activity noted over the right posterior fifth rib, possibly the overlying tip of the scapula. Bilateral rib series suggested for further evaluation of these findings. No other bony abnormalities identified . Electronically Signed   By: TMarcello Moores Register   On: 03/05/2016 14:59   Mr Breast Bilateral W Wo Contrast  Addendum Date: 02/21/2016   ADDENDUM REPORT: 02/21/2016 14:07 ADDENDUM: The patient's clinical history should state that biopsy of an enlarged left axillary lymph node was positive for metastatic carcinoma of breast or salivary primary origin. Electronically Signed   By: EPamelia HoitM.D.   On: 02/21/2016 14:07   Result Date: 02/21/2016 CLINICAL DATA:  51year old female with biopsy of a left breast distortion revealing a complex sclerosing lesion and lobular carcinoma in situ. Biopsy of an enlarged left axillary lymph node was positive for metastatic carcinoma of breast or solitary primary origin. LABS:  No labs were performed at the imaging center today. EXAM: BILATERAL BREAST MRI WITH AND WITHOUT CONTRAST TECHNIQUE: Multiplanar, multisequence MR images of both breasts were obtained prior to and following the intravenous administration of 14 ml of MultiHance. THREE-DIMENSIONAL MR IMAGE RENDERING  ON INDEPENDENT WORKSTATION: Three-dimensional MR images were rendered by post-processing of the original MR data on an independent workstation. The three-dimensional MR images were interpreted, and findings are reported in the following complete MRI report for this study. Three dimensional images were evaluated at the independent DynaCad workstation COMPARISON:  Previous exam(s). FINDINGS: Breast composition: c. Heterogeneous fibroglandular tissue. Background parenchymal enhancement: Moderate. Right breast: No dominant mass or suspicious enhancement. There is a background of multiple bilateral foci of enhancement. Left breast: Artifact from the X shaped biopsy marker is seen within the lower, outer left breast on series 3, image 171. Non mass enhancement surrounds the biopsy site measuring 2.5 x 1.3 x 1.9 cm (TR x AP x CC). Within the posterior, upper, outer left breast, there is focal non mass enhancement measuring 8 x 6 x 5 mm with associated washout kinetics (series 6, image 132). There is a background of multiple bilateral foci of enhancement. Lymph nodes: Multiple abnormal left axillary lymph nodes including the biopsied 1.7 cm lymph node seen on series 5, image 67 containing biopsy marker artifact.  No suspicious appearing right axillary lymph nodes. Ancillary findings:  None. IMPRESSION: 1. Indeterminate focal area of non mass enhancement within the posterior upper, outer left breast. 2. Non mass enhancement surrounds the core biopsy site demonstrating a complex sclerosing lesion and lobular carcinoma in situ within the inferior left breast. 3. Left axillary adenopathy consistent with the known biopsy proven metastatic axillary disease. RECOMMENDATION: 1. MRI guided biopsy of the focal non mass enhancement within the posterior upper, outer left breast. 2. Treatment plan for the known left axillary metastatic disease. Surgical excision of the left breast biopsy site demonstrating a complex sclerosing lesion and  lobular carcinoma in situ. BI-RADS CATEGORY  4: Suspicious. Electronically Signed: By: Pamelia Hoit M.D. On: 02/19/2016 17:55   Mm Clip Placement Left  Result Date: 02/27/2016 CLINICAL DATA:  Post MRI guided core needle biopsy of left breast. EXAM: DIAGNOSTIC LEFT MAMMOGRAM POST MRI BIOPSY COMPARISON:  Previous exam(s). FINDINGS: Mammographic images were obtained following MRI guided biopsy of left breast upper outer quadrant area of abnormal enhancement. Two-view mammography demonstrates presence of dumbbell-shaped marker within the left breast upper outer quadrant, posterior depth, in the expected location of the abnormal MRI enhancement. Expected post biopsy changes are seen. IMPRESSION: Successful placement of dumbbell-shaped marker within the left breast upper outer quadrant, post MRI guided core needle biopsy. Final Assessment: Post Procedure Mammograms for Marker Placement Electronically Signed   By: Fidela Salisbury M.D.   On: 02/27/2016 10:57   Mr Aundra Millet Breast Bx Johnella Moloney Dev 1st Lesion Image Bx Spec Mr Guide  Addendum Date: 03/04/2016   ADDENDUM REPORT: 03/01/2016 13:59 ADDENDUM: Pathology revealed FIBROCYSTIC CHANGES WITH SCLEROSING ADENOSIS, ASSOCIATED RARE MICROCALCIFICATION of the Left breast. This was found to be concordant by Dr. Fidela Salisbury. Pathology results were discussed with the patient by telephone by Dr. Jetta Lout. The patient reported doing well after the biopsy with tenderness at the site. Post biopsy instructions and care were reviewed and questions were answered. The patient was encouraged to call [facility] for any additional concerns. The patient has a recent diagnosis of COMPLEX SCLEROSING LESION WITH FIBROCYSTIC CHANGES, USUAL DUCTAL HYPERPLASIA AND LOBULAR CARCINOMA IN SITU WITH ASSOCIATED MICROCALCIFICATIONS of the Left breast, 5:30 o'clock with a LYMPH NODE POSITIVE FOR METASTATIC CARCINOMA of the Left axilla. She should follow her outlined  treatment plan. Pathology results reported by Terie Purser, RN on 03/01/2016. Electronically Signed   By: Fidela Salisbury M.D.   On: 03/01/2016 13:59   Result Date: 03/04/2016 CLINICAL DATA:  Left breast upper outer quadrant area of abnormal enhancement seen on recent MRI. EXAM: MRI GUIDED CORE NEEDLE BIOPSY OF THE LEFT BREAST TECHNIQUE: Multiplanar, multisequence MR imaging of the left breast was performed both before and after administration of intravenous contrast. CONTRAST:  14 cc MultiHance intravenously. COMPARISON:  Previous exams. FINDINGS: I met with the patient, and we discussed the procedure of MRI guided biopsy, including risks, benefits, and alternatives. Specifically, we discussed the risks of infection, bleeding, tissue injury, clip migration, and inadequate sampling. Informed, written consent was given. The usual time out protocol was performed immediately prior to the procedure. Using sterile technique, 1% Lidocaine, MRI guidance, and a 9 gauge vacuum assisted device, biopsy was performed of area of abnormal enhancement in the left breast upper outer quadrant, posterior depth, using a lateral approach. At the conclusion of the procedure, a dumbbell-shaped tissue marker clip was deployed into the biopsy cavity. Follow-up 2-view mammogram was performed and dictated separately. IMPRESSION: MRI  guided biopsy of left breast. No apparent complications. Electronically Signed: By: Fidela Salisbury M.D. On: 02/27/2016 10:54    ELIGIBLE FOR AVAILABLE RESEARCH PROTOCOL: no  ASSESSMENT: 51 y.o. [place] woman status post left axillary lymph node biopsy 01/31/2016 showing invasive ductal carcinoma, high-grade, E-cadherin positive, weakly estrogen receptor positive at 5%, progesterone receptor negative, with an MIB-1 of 60%, but HER-2 amplified, the signals ratio being 5.46 and the number per cell 6.55.  (a) biopsy of a left breast lower outer quadrant irregularity 01/31/2016 showed a sclerosing  lesion and LCIS  (b) biopsy of a left breast upper outer quadrant irregularity 02/27/2016 was benign  (1) neoadjuvant chemotherapy will consist of carboplatin, docetaxel, trastuzumab and pertuzumab given every 21 days for 6 cycles  (a) baseline echocardiogram pending  (b) port placement pending  (2) trastuzumab and pertuzumab to be continued to complete a year  (3) definitive surgery at the completion of chemotherapy  (4) adjuvant radiation to follow as appropriate  (5) consider anti-estrogens at the completion of local therapy   PLAN: I spent approximately 30 minutes with Almyra Free today, almost all of it in review and counseling regarding her new diagnosis. I offered to transfer her to my partner Dr.Feng who speaks Mandarin, but the patient tells me she is very comfortable with me and wants to keep me as her physician. She understands that I worry that she may not be fully understanding what I am saying even if I write at down but she really does not want a translator to be present  Given these constraints, we spent most of the day today explaining that he has the pathology is definitive; that the scans do not show cancer but that there are lesions we ended up calling little spots in the bones and that these need to be further followed up on; we reviewed the upcoming port placement, echocardiogram, and need for chemotherapy school. After all that we decided to every 14 would be the starting day for her chemotherapy.  We again reviewed the side effects, toxicities and complications of both the chemotherapy agents and the anti-HER-2 agents. She is going to see me the first day of treatment to clear up any remaining questions at that point I will set her up for her additional follow-up visits and treatments.  She knows to call for any problems that may develop before her next visit here.  Chauncey Cruel, MD   03/08/2016 12:34 PM Medical Oncology and Hematology [facility] [address] [place], [place] [address] Tel. [phone]    Fax. [phone]

## 2016-03-11 ENCOUNTER — Encounter (HOSPITAL_COMMUNITY): Payer: Self-pay | Admitting: Internal Medicine

## 2016-03-11 ENCOUNTER — Ambulatory Visit (HOSPITAL_COMMUNITY)
Admission: RE | Admit: 2016-03-11 | Discharge: 2016-03-11 | Disposition: A | Discharge status: Home / Self Care | Payer: BLUE CROSS/BLUE SHIELD | Source: Ambulatory Visit | Attending: Internal Medicine | Admitting: Internal Medicine

## 2016-03-11 ENCOUNTER — Telehealth: Payer: Self-pay | Admitting: *Deleted

## 2016-03-11 ENCOUNTER — Telehealth: Payer: Self-pay

## 2016-03-11 VITALS — BP 104/66 | HR 62 | Wt 151.5 lb

## 2016-03-11 DIAGNOSIS — Z170 Estrogen receptor positive status [ER+]: Secondary | ICD-10-CM | POA: Insufficient documentation

## 2016-03-11 DIAGNOSIS — C50311 Malignant neoplasm of lower-inner quadrant of right female breast: Secondary | ICD-10-CM

## 2016-03-11 DIAGNOSIS — C50912 Malignant neoplasm of unspecified site of left female breast: Secondary | ICD-10-CM | POA: Diagnosis not present

## 2016-03-11 NOTE — Telephone Encounter (Signed)
Pt called stating Dr Jana Hakim told her he would be sending a new prescription to her pharmacy. It is not done and not specified in OV note.  She also asked for further explanation of a spot on her liver as seen on bone scan.

## 2016-03-11 NOTE — Progress Notes (Signed)
CARDIO-ONCOLOGY CLINIC CONSULT NOTE  Referring Physician: Magrinat   HPI:  Jacqueline Avery is a 51 y/o woman with left breast CA diagnosed in 12/17 referred by Dr. Jana Hakim for enrollment into cardio-oncology clinic.   Breast cancer history:  01/31/2016 showing invasive ductal carcinoma, high-grade, E-cadherin positive, weakly estrogen receptor positive at 5%, progesterone receptor negative, with an MIB-1 of 60%, but HER-2 amplified, the signals ratio being 5.46 and the number per cell 6.55.         (1) neoadjuvant chemotherapy will consist of carboplatin, docetaxel, trastuzumab and pertuzumab given every 21 days for 6 cycles             (a) baseline echocardiogram pending             (b) port placement pending (2) trastuzumab and pertuzumab to be continued to complete a year (3) definitive surgery at the completion of chemotherapy (4) adjuvant radiation to follow as appropriate (5) consider anti-estrogens at the completion of local therapy  Denies any h/o known heart disease. Active with walking and swimming. Denies CP, DOE or edema.   Echo 03/08/16 EF 60% Lateral s' 12.6 cm/s GLS -21.8%  Review of Systems: [y] = yes, '[ ]'$  = no   General: Weight gain '[ ]'$ ; Weight loss '[ ]'$ ; Anorexia '[ ]'$ ; Fatigue '[ ]'$ ; Fever '[ ]'$ ; Chills '[ ]'$ ; Weakness '[ ]'$   Cardiac: Chest pain/pressure '[ ]'$ ; Resting SOB '[ ]'$ ; Exertional SOB '[ ]'$ ; Orthopnea '[ ]'$ ; Pedal Edema '[ ]'$ ; Palpitations '[ ]'$ ; Syncope '[ ]'$ ; Presyncope '[ ]'$ ; Paroxysmal nocturnal dyspnea'[ ]'$   Pulmonary: Cough '[ ]'$ ; Wheezing'[ ]'$ ; Hemoptysis'[ ]'$ ; Sputum '[ ]'$ ; Snoring '[ ]'$   GI: Vomiting'[ ]'$ ; Dysphagia'[ ]'$ ; Melena'[ ]'$ ; Hematochezia '[ ]'$ ; Heartburn'[ ]'$ ; Abdominal pain '[ ]'$ ; Constipation '[ ]'$ ; Diarrhea '[ ]'$ ; BRBPR '[ ]'$   GU: Hematuria'[ ]'$ ; Dysuria '[ ]'$ ; Nocturia'[ ]'$   Vascular: Pain in legs with walking '[ ]'$ ; Pain in feet with lying flat '[ ]'$ ; Non-healing sores '[ ]'$ ; Stroke '[ ]'$ ; TIA '[ ]'$ ; Slurred speech '[ ]'$ ;  Neuro: Headaches'[ ]'$ ; Vertigo'[ ]'$ ; Seizures'[ ]'$ ; Paresthesias'[ ]'$ ;Blurred vision '[ ]'$ ;  Diplopia '[ ]'$ ; Vision changes '[ ]'$   Ortho/Skin: Arthritis '[ ]'$ ; Joint pain '[ ]'$ ; Muscle pain '[ ]'$ ; Joint swelling '[ ]'$ ; Back Pain '[ ]'$ ; Rash '[ ]'$   Psych: Depression'[ ]'$ ; Anxiety'[ ]'$   Heme: Bleeding problems '[ ]'$ ; Clotting disorders '[ ]'$ ; Anemia '[ ]'$   Endocrine: Diabetes '[ ]'$ ; Thyroid dysfunction'[ ]'$   PMHx:  1. Breast cancer   Current Outpatient Prescriptions  Medication Sig Dispense Refill  . b complex vitamins capsule Take 1 capsule by mouth daily.    . Calcium Carbonate-Vitamin D (CALCIUM-VITAMIN D3 PO) Take 1 tablet by mouth daily.    . Cholecalciferol (VITAMIN D3) 5000 units CAPS Take 5,000 Units by mouth daily.    . TURMERIC PO Take 1 capsule by mouth daily.      No current facility-administered medications for this encounter.     No Known Allergies    Social History   Social History  . Marital status: Married    Spouse name: N/A  . Number of children: N/A  . Years of education: N/A   Occupational History  . Not on file.   Social History Main Topics  . Smoking status: Never Smoker  . Smokeless tobacco: Never Used  . Alcohol use No  . Drug use: No  . Sexual activity: Not on file   Other Topics Concern  . Not on file  Social History Narrative  . 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 He works in [employer] however. One of the patient's children is studying medicine at the [employer].  Fam[employer];  The patient's father died from heart disease at the age of 44. The patient's mother is living as of January 2018, in her early 30s. The patient has one brother, 2 sisters. There is no history of breast or ovarian cancer in the family to the patient's knowledge  Vitals:   03/11/16 1042  BP: 104/66  Pulse: 62  SpO2: 100%  Weight: 151 lb 8 oz (68.7 kg)    PHYSICAL EXAM: General:  Well appearing. No respiratory difficulty HEENT: normal Neck: supple. no JVD. Carotids 2+ bilat; no bruits. No lymphadenopathy or thryomegaly  appreciated. Cor: PMI nondisplaced. Regular rate & rhythm. No rubs, gallops or murmurs. Lungs: clear Abdomen: soft, nontender, nondistended. No hepatosplenomegaly. No bruits or masses. Good bowel sounds. Extremities: no cyanosis, clubbing, rash, edema Neuro: alert & oriented x 3, cranial nerves grossly intact. moves all 4 extremities w/o difficulty. Affect pleasant.   ASSESSMENT & PLAN:  1. Left breast CA    --HER2 +     Explained incidence of Herceptin cardiotoxicity and role of Cardio-oncology clinic at length. Echo images reviewed personally. All parameters stable. Reviewed signs and symptoms of HF to look for. OK to start Herceptin. Follow-up with echo in 3 months.  Bensimhon, Daniel,MD 12:23 PM

## 2016-03-11 NOTE — Patient Instructions (Signed)
Follow up with Echo in 3 months

## 2016-03-12 ENCOUNTER — Encounter: Payer: Self-pay | Admitting: Oncology

## 2016-03-12 ENCOUNTER — Ambulatory Visit (HOSPITAL_COMMUNITY)
Admission: RE | Admit: 2016-03-12 | Discharge: 2016-03-12 | Disposition: A | Discharge status: Home / Self Care | Payer: BLUE CROSS/BLUE SHIELD | Source: Ambulatory Visit | Attending: Oncology | Admitting: Oncology

## 2016-03-12 DIAGNOSIS — C50912 Malignant neoplasm of unspecified site of left female breast: Secondary | ICD-10-CM

## 2016-03-13 ENCOUNTER — Telehealth: Payer: Self-pay

## 2016-03-13 NOTE — Telephone Encounter (Signed)
Pt called with 3 questions: 1. Result of xray - informed her rib xray showed no problems 2. Dr Jana Hakim did not send chemotherapy home meds to pharmacy 3. She s/w her insurance company and they will be contacting us for information. I believe this was for FMLA.

## 2016-03-13 NOTE — Telephone Encounter (Signed)
No entry 

## 2016-03-14 ENCOUNTER — Other Ambulatory Visit: Payer: Self-pay | Admitting: Emergency Medicine

## 2016-03-14 ENCOUNTER — Encounter (HOSPITAL_BASED_OUTPATIENT_CLINIC_OR_DEPARTMENT_OTHER): Payer: Self-pay | Admitting: *Deleted

## 2016-03-14 DIAGNOSIS — C773 Secondary and unspecified malignant neoplasm of axilla and upper limb lymph nodes: Principal | ICD-10-CM

## 2016-03-14 DIAGNOSIS — C50912 Malignant neoplasm of unspecified site of left female breast: Secondary | ICD-10-CM

## 2016-03-14 MED ORDER — LORAZEPAM 0.5 MG PO TABS: 0.5000 mg | ORAL_TABLET | Freq: Four times a day (QID) | ORAL | 0 refills | Status: AC | PRN

## 2016-03-14 MED ORDER — DEXAMETHASONE 4 MG PO TABS: 8.0000 mg | ORAL_TABLET | Freq: Two times a day (BID) | ORAL | 1 refills | Status: DC

## 2016-03-14 MED ORDER — ONDANSETRON HCL 8 MG PO TABS: 8.0000 mg | ORAL_TABLET | Freq: Two times a day (BID) | ORAL | 1 refills | Status: AC | PRN

## 2016-03-14 MED ORDER — LIDOCAINE-PRILOCAINE 2.5-2.5 % EX CREA: TOPICAL_CREAM | CUTANEOUS | 3 refills | Status: AC

## 2016-03-14 MED ORDER — PROCHLORPERAZINE MALEATE 10 MG PO TABS: 10.0000 mg | ORAL_TABLET | Freq: Four times a day (QID) | ORAL | 1 refills | Status: AC | PRN

## 2016-03-14 NOTE — Telephone Encounter (Signed)
Spoke with patient; advised patient that prescriptions have been sent to her pharmacy as ordered per Dr Jana Hakim.   Patient again very concerned about the 2 readings of the bone scan and the rib x ray. Will further review these with Dr Jana Hakim and notify patient as to why these are different. Patient verbalized understanding.

## 2016-03-15 ENCOUNTER — Encounter (HOSPITAL_COMMUNITY): Payer: BLUE CROSS/BLUE SHIELD

## 2016-03-15 NOTE — Progress Notes (Addendum)
Location of Breast Cancer:  Histology per Pathology Report: Diagnosis 02/27/2016 Dr. Coralie Keens, MD , no surgery scheduled as yet Breast, left, needle core biopsy, UOQ - FIBROCYSTIC CHANGES WITH SCLEROSING ADENOSIS. - ASSOCIATED RARE MICROCALCIFICATION. - NO ATYPIA, HYPERPLASIA, OR MALIGNANCY IDENTIFIED.  Diagnosis 01/31/2016: 1. Lymph node, needle/core biopsy, left axilla - LYMPH NODE POSITIVE FOR METASTATIC CARCINOMA. - SEE COMMENT. 2. Breast, left, needle core biopsy, 5:30 o'clock - COMPLEX SCLEROSING LESION WITH FIBROCYSTIC CHANGES, USUAL DUCTAL HYPERPLASIA AND LOBULAR CARCINOMA IN SITU WITH ASSOCIATED MICROCALCIFICATIONS. - SEE COMMENT Receptor Status: ER(5%+), PR (neg), Her2-neu (ratio 5.46 ), Ki-()   Did patient present with symptoms (if so, please note symptoms) or was this found on screening mammography?: B/L screening mammogram ,then had U/S  after  Past/Anticipated interventions by surgeon, if any:  Past/Anticipated interventions by medical oncology, if any: Chemotherapy : Dr. Jana Hakim Note:03/08/16:, (1) neoadjuvant chemotherapy will consist of carboplatin, docetaxel, trastuzumab and pertuzumab given every 21 days for 6 cycles             (a) baseline echocardiogram pending             (b) port placement pending  (2) trastuzumab and pertuzumab to be continued to complete a year  (3) definitive surgery at the completion of chemotherapy  (4) adjuvant radiation to follow as appropriate  (5) consider anti-estrogens at the completion of local therapy   Lymphedema issues, if any:   NO  Pain issues, if any: NO  SAFETY ISSUES:  Prior radiation? NO  Pacemaker/ICD? NO  Possible current pregnancy? NO  Is the patient on methotrexate? NO  Current Complaints / other details: Married, menarche age 57, GXP2,1st live birth age 59, no tobacco use,no alcohol or drug use Father deceased heart disease age 74,  Mother living, no hx breast or ovarian cancer     Roslynn Amble, Felicita Gage, RN 03/15/2016,9:41 AM  BP 111/66 (BP Location: Right Arm, Patient Position: Sitting, Cuff Size: Normal)   Pulse 61   Temp 97.6 F (36.4 C) (Oral)   Resp 20   Ht '5\' 6"'  (1.676 m)   Wt 148 lb 6.4 oz (67.3 kg)   LMP 03/05/2016 (Exact Date)   BMI 23.95 kg/m   Wt Readings from Last 3 Encounters:  03/18/16 148 lb 6.4 oz (67.3 kg)  03/11/16 151 lb 8 oz (68.7 kg)  03/08/16 149 lb 3.2 oz (67.7 kg)

## 2016-03-18 ENCOUNTER — Encounter: Payer: Self-pay | Admitting: Radiation Oncology

## 2016-03-18 ENCOUNTER — Ambulatory Visit
Admission: RE | Admit: 2016-03-18 | Discharge: 2016-03-18 | Disposition: A | Discharge status: Home / Self Care | Payer: BLUE CROSS/BLUE SHIELD | Source: Ambulatory Visit | Attending: Radiation Oncology | Admitting: Radiation Oncology

## 2016-03-18 ENCOUNTER — Encounter: Payer: Self-pay | Admitting: Oncology

## 2016-03-18 VITALS — BP 111/66 | HR 61 | Temp 97.6°F | Resp 20 | Ht 66.0 in | Wt 148.4 lb

## 2016-03-18 DIAGNOSIS — C773 Secondary and unspecified malignant neoplasm of axilla and upper limb lymph nodes: Secondary | ICD-10-CM | POA: Diagnosis not present

## 2016-03-18 DIAGNOSIS — Z79899 Other long term (current) drug therapy: Secondary | ICD-10-CM | POA: Insufficient documentation

## 2016-03-18 DIAGNOSIS — Z170 Estrogen receptor positive status [ER+]: Secondary | ICD-10-CM | POA: Diagnosis not present

## 2016-03-18 DIAGNOSIS — C50912 Malignant neoplasm of unspecified site of left female breast: Secondary | ICD-10-CM | POA: Diagnosis not present

## 2016-03-18 NOTE — Progress Notes (Signed)
Please see the Nurse Progress Note in the MD Initial Consult Encounter for this patient. 

## 2016-03-18 NOTE — Progress Notes (Signed)
Radiation Oncology         [phone] ________________________________  Name: Jacqueline Avery MRN: [id number]  Date: 03/18/2016  DOB: 06-Jan-1966  LF:YBOFB,PZWCH RUTH, MD  Coralie Keens, MD     REFERRING PHYSICIAN: Coralie Keens, MD   DIAGNOSIS: The encounter diagnosis was Primary malignant neoplasm of left breast with metastasis to movable ipsilateral level 1 or 2 axillary lymph nodes (N1) (Nathalie).   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 female who is seen for an initial consultation visit regarding the patient's diagnosis of left breast cancer.  The patient had bilateral screening mammography with tomography at the [facility] on 01/16/2016. This showed an area of possible distortion in the left breast and a possibly enlarged left axillary lymph node. The right breast was negative.  She was recalled for diagnostic mammography with tomography of the left breast and left breast ultrasonography on 01/24/2016. The breast density was category C. In the central left breast there was an area of distortion and in the left axilla there was at least 1 lymph node with a thickened cortex. Ultrasonography found no definite correlation to the area of distortion in the left breast. An area of mixed echogenicity at the 5:00 position 4 cm from the nipple measuring 1.3 x 0.3 x 0.7 cm was indeterminate. However ultrasound did show several enlarged and abnormal lymph nodes the largest measuring 1.6 x 1.2 x 1.4 cm corresponding with the abnormal lymph nodes at mammography.  The patient underwent biopsies on 01/31/16. Biopsy of the left breast at the 5:30 o'clock position found a complex sclerosing lesion with fibrocystic changes, usual ductal hyperplasia, and lobular carcinoma in situ with associated microcalcifications. The lymph node however was positive for  metastatic carcinoma, positive for cytokeratin 7, gross cystic disease fluid protein, and e-cadherin, with very few estrogen receptor positive cells. ER was 5% positive with moderate staining intensity, progesterone receptor was negative, and the proliferation marker was 60%. HER-2 however was amplified the signals ratio being 5.46 and the number per cell 6.55.  On 02/18/2016 the patient underwent bilateral breast MRI. This showed non-mass enhancement measuring 2.5 x 1.3 x 1.9 cm surrounding the biopsy side, focal non-mass enhancement in the posterior upper outer left breast measuring 0.8 x 0.6 x 0.5 cm with associated washout kinetics, and multiple abnormal left axillary lymph nodes.  The patient saw Dr. Jana Hakim on 02/26/16 who discussed neoadjuvant chemotherapy with carboplatin, docetaxel, trastuzumab, and pertuzumab every 21 days for 6 cycles. The patient would continue trastuzumab and pertuzumab to complete a year. Definitive surgery would be performed at the completion of chemotherapy.  The patient underwent MRI guided biopsy of the upper outer quadrant of the left breast on 02/27/16 revealing fibrocystic changes with sclerosing adenosis, associated rare microcalcification, and no malignancy.  CT of the chest w/ contrast on 03/05/16 showed left axillary lymphadenopathy with no other evidence of metastatic disease in the thorax. Bone scan the same day showed punctate areas of increased activity along the left anterior first rib and right posterior fifth rib (possibly the overlying tip of the scapula). These areas need further evaluation.  The patient followed up with Dr. Jana Hakim on 03/08/16 who discussed her scans and chemotherapy plan. The patient underwent a baseline echocardiogram the same day and was cleared to have Herceptin.  The patient and her husband present today to discuss radiation for  the management of her disease.  PREVIOUS RADIATION THERAPY: No   PAST MEDICAL HISTORY:  has no past  medical history on file.     PAST SURGICAL HISTORY:No past surgical history on file.   FAMILY HISTORY: family history is not on file.   SOCIAL HISTORY:  reports that she has never smoked. She has never used smokeless tobacco. She reports that she does not drink alcohol or use drugs.   ALLERGIES: Patient has no known allergies.   MEDICATIONS:  Current Outpatient Prescriptions  Medication Sig Dispense Refill  . b complex vitamins capsule Take 1 capsule by mouth daily.    . Calcium Carbonate-Vitamin D (CALCIUM-VITAMIN D3 PO) Take 1 tablet by mouth daily.    Marland Kitchen dexamethasone (DECADRON) 4 MG tablet Take 2 tablets (8 mg total) by mouth 2 (two) times daily. Start the day before Taxotere. Then again the day after chemo for 3 days. 30 tablet 1  . lidocaine-prilocaine (EMLA) cream Apply to affected area once 30 g 3  . TURMERIC PO Take 1 capsule by mouth daily.     . Vitamin D, Ergocalciferol, (DRISDOL) 50000 units CAPS capsule Take 50,000 Units by mouth every 7 (seven) days.    . Cholecalciferol (VITAMIN D3) 5000 units CAPS Take 5,000 Units by mouth daily.    Marland Kitchen LORazepam (ATIVAN) 0.5 MG tablet Take 1 tablet (0.5 mg total) by mouth every 6 (six) hours as needed (Nausea or vomiting). (Patient not taking: Reported on 03/18/2016) 30 tablet 0  . ondansetron (ZOFRAN) 8 MG tablet Take 1 tablet (8 mg total) by mouth 2 (two) times daily as needed for refractory nausea / vomiting. Start on day 3 after chemo. (Patient not taking: Reported on 03/18/2016) 30 tablet 1  . prochlorperazine (COMPAZINE) 10 MG tablet Take 1 tablet (10 mg total) by mouth every 6 (six) hours as needed (Nausea or vomiting). (Patient not taking: Reported on 03/18/2016) 30 tablet 1   No current facility-administered medications for this encounter.      REVIEW OF SYSTEMS:  On review of systems, the patient reports that she is doing well overall. She denies any chest pain, shortness of breath, cough, fevers, chills, night sweats, unintended  weight changes. She denies any bowel or bladder disturbances, and denies abdominal pain, nausea or vomiting. She denies any new musculoskeletal or joint aches or pains, new skin lesions or concerns. A complete review of systems is obtained and is otherwise negative.   PHYSICAL EXAM:  height is _0  (1.676 m) and weight is 148 lb 6.4 oz (67.3 kg). Her oral temperature is 97.6 F (36.4 C). Her blood pressure is 111/66 and her pulse is 61. Her respiration is 20.   In general this is a well appearing Asian female in no acute distress. She is alert and oriented x4 and appropriate throughout the examination.    ECOG = 0  0 - Asymptomatic (Fully active, able to carry on all predisease activities without restriction)  1 - Symptomatic but completely ambulatory (Restricted in physically strenuous activity but ambulatory and able to carry out work of a light or sedentary nature. For example, light housework, office work)  2 - Symptomatic, <50% in bed during the day (Ambulatory and capable of all self care but unable to carry out any work activities. Up and about more than 50% of waking hours)  3 - Symptomatic, >50% in bed, but not bedbound (Capable of only limited self-care, confined to bed or chair 50% or more of waking hours)  4 - Bedbound (Completely disabled. Cannot carry on any self-care. Totally confined to bed or chair)  5 - Death   Eustace Pen MM, Creech RH, Tormey DC, et al. [phone]). "Toxicity and response criteria of the [facility]". [place] Oncol. 5 (6): 649-55     LABORATORY DATA:  Lab Results  Component Value Date   WBC 6.3 02/26/2016   HGB 13.8 02/26/2016   HCT 42.0 02/26/2016   MCV 88.6 02/26/2016   PLT 123 Large & giant platelets (L) 02/26/2016   Lab Results  Component Value Date   NA 141 02/26/2016   K 4.7 02/26/2016   CO2 29 02/26/2016   Lab Results  Component Value Date   ALT 17 02/26/2016   AST 12 02/26/2016   ALKPHOS 89 02/26/2016    BILITOT 0.54 02/26/2016      RADIOGRAPHY: Dg Ribs Bilateral  Result Date: 03/12/2016 CLINICAL DATA:  Left breast cancer.  Abnormality seen on bone scan. EXAM: BILATERAL RIBS - 3+ VIEW COMPARISON:  None. FINDINGS: No fracture or other bone lesions are seen involving the ribs. In particular, no focal bone abnormality noted in the region of the anterior left first rib or posterior right fifth rib. Lungs are clear. Heart is normal size. No effusions or pneumothorax. IMPRESSION: No acute cardiopulmonary disease. No visible focal rib abnormality. Electronically Signed   By: Rolm Baptise M.D.   On: 03/12/2016 13:40   Ct Chest W Contrast  Result Date: 03/05/2016 CLINICAL DATA:  Left-sided breast cancer. EXAM: CT CHEST WITH CONTRAST TECHNIQUE: Multidetector CT imaging of the chest was performed during intravenous contrast administration. CONTRAST:  89m ISOVUE-300 IOPAMIDOL (ISOVUE-300) INJECTION 61% COMPARISON:  None. FINDINGS: Cardiovascular: The heart size is normal. No pericardial effusion. No thoracic aortic aneurysm. Mediastinum/Nodes: No mediastinal lymphadenopathy. No evidence for internal mammary lymphadenopathy. There is no hilar lymphadenopathy. No right axillary lymphadenopathy. 13 mm left axillary lymph node is associated with other small left axillary lymph nodes. Lungs/Pleura: Dependent compressive atelectasis noted in the lower lungs bilaterally. No focal airspace consolidation. No pulmonary edema or pleural effusion. Upper Abdomen: No enhancing lesions identified in the visualized portion of the upper liver. Musculoskeletal: Bone windows reveal no worrisome lytic or sclerotic osseous lesions. IMPRESSION: 1. Left axillary lymphadenopathy. No other lymphadenopathy or evidence of metastatic disease in the thorax. Electronically Signed   By: EMisty StanleyM.D.   On: 03/05/2016 14:44   Nm Bone Scan Whole Body  Result Date: 03/05/2016 CLINICAL DATA:  Left breast neoplasm. EXAM: NUCLEAR MEDICINE WHOLE  BODY BONE SCAN TECHNIQUE: Whole body anterior and posterior images were obtained approximately 3 hours after intravenous injection of radiopharmaceutical. RADIOPHARMACEUTICALS:  20.4 mCi Technetium-944mDP IV COMPARISON:  No prior. FINDINGS: Bilateral renal function excretion. Increased activity noted along the left anterior first rib, left rib series suggested for further evaluation. Increased activity noted right posterior fifth rib, this may be the tip of the scapula. Right rib series suggest for further evaluation. No other bony abnormalities identified. IMPRESSION: Punctate area of increased activity noted along the left anterior first rib. Punctate area of increased activity noted over the right posterior fifth rib, possibly the overlying tip of the scapula. Bilateral rib series suggested for further evaluation of these findings. No other bony abnormalities identified . Electronically Signed   By: ThMarcello MooresRegister   On: 03/05/2016 14:59   Mr Breast Bilateral W Wo Contrast  Addendum Date: 02/21/2016   ADDENDUM REPORT: 02/21/2016 14:07 ADDENDUM: The patient's clinical history should state  that biopsy of an enlarged left axillary lymph node was positive for metastatic carcinoma of breast or salivary primary origin. Electronically Signed   By: Pamelia Hoit M.D.   On: 02/21/2016 14:07   Result Date: 02/18/2016 CLINICAL DATA:  51 year old female with biopsy of a left breast distortion revealing a complex sclerosing lesion and lobular carcinoma in situ. Biopsy of an enlarged left axillary lymph node was positive for metastatic carcinoma of breast or solitary primary origin. LABS:  No labs were performed at the imaging center today. EXAM: BILATERAL BREAST MRI WITH AND WITHOUT CONTRAST TECHNIQUE: Multiplanar, multisequence MR images of both breasts were obtained prior to and following the intravenous administration of 14 ml of MultiHance. THREE-DIMENSIONAL MR IMAGE RENDERING ON INDEPENDENT WORKSTATION:  Three-dimensional MR images were rendered by post-processing of the original MR data on an independent workstation. The three-dimensional MR images were interpreted, and findings are reported in the following complete MRI report for this study. Three dimensional images were evaluated at the independent DynaCad workstation COMPARISON:  Previous exam(s). FINDINGS: Breast composition: c. Heterogeneous fibroglandular tissue. Background parenchymal enhancement: Moderate. Right breast: No dominant mass or suspicious enhancement. There is a background of multiple bilateral foci of enhancement. Left breast: Artifact from the X shaped biopsy marker is seen within the lower, outer left breast on series 3, image 171. Non mass enhancement surrounds the biopsy site measuring 2.5 x 1.3 x 1.9 cm (TR x AP x CC). Within the posterior, upper, outer left breast, there is focal non mass enhancement measuring 8 x 6 x 5 mm with associated washout kinetics (series 6, image 132). There is a background of multiple bilateral foci of enhancement. Lymph nodes: Multiple abnormal left axillary lymph nodes including the biopsied 1.7 cm lymph node seen on series 5, image 67 containing biopsy marker artifact. No suspicious appearing right axillary lymph nodes. Ancillary findings:  None. IMPRESSION: 1. Indeterminate focal area of non mass enhancement within the posterior upper, outer left breast. 2. Non mass enhancement surrounds the core biopsy site demonstrating a complex sclerosing lesion and lobular carcinoma in situ within the inferior left breast. 3. Left axillary adenopathy consistent with the known biopsy proven metastatic axillary disease. RECOMMENDATION: 1. MRI guided biopsy of the focal non mass enhancement within the posterior upper, outer left breast. 2. Treatment plan for the known left axillary metastatic disease. Surgical excision of the left breast biopsy site demonstrating a complex sclerosing lesion and lobular carcinoma in situ.  BI-RADS CATEGORY  4: Suspicious. Electronically Signed: By: Pamelia Hoit M.D. On: 02/19/2016 17:55   Mm Clip Placement Left  Result Date: 02/27/2016 CLINICAL DATA:  Post MRI guided core needle biopsy of left breast. EXAM: DIAGNOSTIC LEFT MAMMOGRAM POST MRI BIOPSY COMPARISON:  Previous exam(s). FINDINGS: Mammographic images were obtained following MRI guided biopsy of left breast upper outer quadrant area of abnormal enhancement. Two-view mammography demonstrates presence of dumbbell-shaped marker within the left breast upper outer quadrant, posterior depth, in the expected location of the abnormal MRI enhancement. Expected post biopsy changes are seen. IMPRESSION: Successful placement of dumbbell-shaped marker within the left breast upper outer quadrant, post MRI guided core needle biopsy. Final Assessment: Post Procedure Mammograms for Marker Placement Electronically Signed   By: Fidela Salisbury M.D.   On: 02/27/2016 10:57   Mr Aundra Millet Breast Bx Johnella Moloney Dev 1st Lesion Image Bx Spec Mr Guide  Addendum Date: 03/04/2016   ADDENDUM REPORT: 03/01/2016 13:59 ADDENDUM: Pathology revealed FIBROCYSTIC CHANGES WITH SCLEROSING ADENOSIS, ASSOCIATED RARE MICROCALCIFICATION of the  Left breast. This was found to be concordant by Dr. Fidela Salisbury. Pathology results were discussed with the patient by telephone by Dr. Jetta Lout. The patient reported doing well after the biopsy with tenderness at the site. Post biopsy instructions and care were reviewed and questions were answered. The patient was encouraged to call [facility] for any additional concerns. The patient has a recent diagnosis of COMPLEX SCLEROSING LESION WITH FIBROCYSTIC CHANGES, USUAL DUCTAL HYPERPLASIA AND LOBULAR CARCINOMA IN SITU WITH ASSOCIATED MICROCALCIFICATIONS of the Left breast, 5:30 o'clock with a LYMPH NODE POSITIVE FOR METASTATIC CARCINOMA of the Left axilla. She should follow her outlined treatment plan. Pathology  results reported by Terie Purser, RN on 03/01/2016. Electronically Signed   By: Fidela Salisbury M.D.   On: 03/01/2016 13:59   Result Date: 02/27/2016 CLINICAL DATA:  Left breast upper outer quadrant area of abnormal enhancement seen on recent MRI. EXAM: MRI GUIDED CORE NEEDLE BIOPSY OF THE LEFT BREAST TECHNIQUE: Multiplanar, multisequence MR imaging of the left breast was performed both before and after administration of intravenous contrast. CONTRAST:  14 cc MultiHance intravenously. COMPARISON:  Previous exams. FINDINGS: I met with the patient, and we discussed the procedure of MRI guided biopsy, including risks, benefits, and alternatives. Specifically, we discussed the risks of infection, bleeding, tissue injury, clip migration, and inadequate sampling. Informed, written consent was given. The usual time out protocol was performed immediately prior to the procedure. Using sterile technique, 1% Lidocaine, MRI guidance, and a 9 gauge vacuum assisted device, biopsy was performed of area of abnormal enhancement in the left breast upper outer quadrant, posterior depth, using a lateral approach. At the conclusion of the procedure, a dumbbell-shaped tissue marker clip was deployed into the biopsy cavity. Follow-up 2-view mammogram was performed and dictated separately. IMPRESSION: MRI guided biopsy of left breast. No apparent complications. Electronically Signed: By: Fidela Salisbury M.D. 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 We discussed the equivalence in terms of survival and local failure between mastectomy and breast conservation. We discussed the role of radiation in decreasing local failures in patients who undergo mastectomy or lumpectomy since she has risk factors for  recurrence due to positive lymph nodes. We discussed the process of CT simulation and the placement tattoos. We discussed 6 1/2 weeks of treatment as an outpatient. We discussed the possibility of asymptomatic lung damage and the low likelihood of secondary malignancies. We discussed the possible side effects including but not limited to skin redness, fatigue, permanent skin darkening, and swelling. We discussed the use of cardiac sparing with deep inspiration breath hold if needed.  PLAN: The patient will undergo neoadjuvant chemotherapy for 6 weeks under the care of Dr. Jana Hakim and then undergo definitive surgery by Dr. Ninfa Linden. She will continue Herceptin and Perjeta to complete 1 year of treatment. The patient will return to the radiation oncology clinic approximately 2-3 weeks after surgery to check the lumpectomy site and then proceed with CT simulation and adjuvant radiation therapy. After radiation, she will be placed on antiestrogen medication by Dr. Jana Hakim to reduce the risk of recurrence.  The patient was seen today for 50 minutes, with the majority of the time spent counseling the patient on his diagnosis of cancer and coordinating his care.  ________________________________   Jodelle Gross, MD, PhD  This document serves as a record of services personally performed by Kyung Rudd, MD. It was created on his behalf by Darcus Austin, a trained medical scribe. The creation of this record is based on the scribe's personal observations and the provider's statements to them. This document has been checked and approved by the attending provider.

## 2016-03-18 NOTE — Progress Notes (Signed)
Received FMLA paperwork fro patient to be completed

## 2016-03-19 ENCOUNTER — Telehealth: Payer: Self-pay | Admitting: Oncology

## 2016-03-19 NOTE — Telephone Encounter (Signed)
Spoke to patient about the form she submitted, was only ROI form she signed, no additional papers to fill out. Patient verified that this is the only form she was given, request that I faxed it to [facility] which I did, fax [phone]

## 2016-03-20 ENCOUNTER — Other Ambulatory Visit: Payer: Self-pay | Admitting: *Deleted

## 2016-03-20 MED ORDER — UNABLE TO FIND: 0 refills | Status: AC

## 2016-03-20 NOTE — H&P (Signed)
Jacqueline Avery  Location: [facility] Surgery Patient #: [id number] DOB: 1965-06-09 Married / Language: English / Race: Asian Female   History of Present Illness (Robt Okuda A. Ninfa Linden MD;  Patient words: New-Breast CA.  The patient is a 51 year old female who presents with breast cancer. This patient is referred by Dr. Darcus Austin after recent diagnosis of breast cancer. On ultrasound, the area of the breast was vague and was approximately 1.3 cm. She underwent a biopsy of the breast which showed a complex sclerosing lesion and LCIS. The biopsy of the lymph node showed metastatic carcinoma which was favored to be breast as it was weakly positive for estrogen. Salivary gland cancer was also in the differential on the pathologist favor breast. She was discussed in our multidisciplinary breast conference. She has had no problems regarding her breasts in the past. She has had no previous biopsies. She denies nipple discharge. There is no family history of breast cancer.   Past Surgical History Malachi Bonds, CMA; No pertinent past surgical history   Diagnostic Studies History Malachi Bonds, CMA;  Mammogram  within last year Pap Smear  1-5 years ago  Allergies Malachi Bonds, CMA;  No Known Drug Allergies 02/14/2016  Medication History Malachi Bonds, CMA;  No Current Medications Medications Reconciled  Social History Malachi Bonds, CMA;  No alcohol use  No caffeine use  No drug use  Tobacco use  Never smoker.  Family History Malachi Bonds, CMA; First Degree Relatives  No pertinent family history   Pregnancy / Birth History Malachi Bonds, [place]; Age at menarche  32 years. Gravida  2 Irregular periods  Length (months) of breastfeeding  12-24 Maternal age  66-30 Para  2  Other Problems ( No pertinent past medical history     Review of Systems  General Not Present- Appetite Loss, Chills, Fatigue, Fever, Night Sweats, Weight Gain and Weight  Loss. Skin Not Present- Change in Wart/Mole, Dryness, Hives, Jaundice, New Lesions, Non-Healing Wounds, Rash and Ulcer. HEENT Present- Wears glasses/contact lenses. Not Present- Earache, Hearing Loss, Hoarseness, Nose Bleed, Oral Ulcers, Ringing in the Ears, Seasonal Allergies, Sinus Pain, Sore Throat, Visual Disturbances and Yellow Eyes. Respiratory Not Present- Bloody sputum, Chronic Cough, Difficulty Breathing, Snoring and Wheezing. Breast Not Present- Breast Mass, Breast Pain, Nipple Discharge and Skin Changes. Cardiovascular Not Present- Chest Pain, Difficulty Breathing Lying Down, Leg Cramps, Palpitations, Rapid Heart Rate, Shortness of Breath and Swelling of Extremities. Gastrointestinal Not Present- Abdominal Pain, Bloating, Bloody Stool, Change in Bowel Habits, Chronic diarrhea, Constipation, Difficulty Swallowing, Excessive gas, Gets full quickly at meals, Hemorrhoids, Indigestion, Nausea, Rectal Pain and Vomiting. Female Genitourinary Not Present- Frequency, Nocturia, Painful Urination, Pelvic Pain and Urgency. Musculoskeletal Not Present- Back Pain, Joint Pain, Joint Stiffness, Muscle Pain, Muscle Weakness and Swelling of Extremities. Neurological Not Present- Decreased Memory, Fainting, Headaches, Numbness, Seizures, Tingling, Tremor, Trouble walking and Weakness. Endocrine Not Present- Cold Intolerance, Excessive Hunger, Hair Changes, Heat Intolerance, Hot flashes and New Diabetes. Hematology Not Present- Blood Thinners, Easy Bruising, Excessive bleeding, Gland problems, HIV and Persistent Infections.  Vitals   Weight: 154 lb Height: 67in Body Surface Area: 1.81 m Body Mass Index: 24.12 kg/m  Temp.: 98.58F(Oral)  Pulse: 63 (Regular)  BP: 126/80 (Sitting, Left Arm, Standard)     Physical Exam General Mental Status-Alert. General Appearance-Consistent with stated age. Hydration-Well hydrated. Voice-Normal.  Head and Neck Head-normocephalic,  atraumatic with no lesions or palpable masses. Trachea-midline. Thyroid Gland Characteristics - normal size and consistency.  Eye Eyeball -  Bilateral-Extraocular movements intact. Sclera/Conjunctiva - Bilateral-No scleral icterus.  Chest and Lung Exam Chest and lung exam reveals -quiet, even and easy respiratory effort with no use of accessory muscles and on auscultation, normal breath sounds, no adventitious sounds and normal vocal resonance. Inspection Chest Wall - Normal. Back - normal.  Breast Breast - Left-Symmetric, Non Tender, No Biopsy scars, no Dimpling, No Inflammation, No Lumpectomy scars, No Mastectomy scars, No Peau d' Orange. Breast - Right-Symmetric, Non Tender, No Biopsy scars, no Dimpling, No Inflammation, No Lumpectomy scars, No Mastectomy scars, No Peau d' Orange. Breast Lump-No Palpable Breast Mass.  Cardiovascular Cardiovascular examination reveals -normal heart sounds, regular rate and rhythm with no murmurs and normal pedal pulses bilaterally.  Abdomen Inspection Inspection of the abdomen reveals - No Hernias. Skin - Scar - no surgical scars. Palpation/Percussion Palpation and Percussion of the abdomen reveal - Soft, Non Tender, No Rebound tenderness, No Rigidity (guarding) and No hepatosplenomegaly. Auscultation Auscultation of the abdomen reveals - Bowel sounds normal.  Neurologic - Did not examine.  Musculoskeletal - Did not examine.  Lymphatic Head & Neck  General Head & Neck Lymphatics: Bilateral - Description - Normal. Axillary  General Axillary Region: Bilateral - Size - 1.0 cm. Note: I can palpate only one enlarged lymph node is +1 cm in the left axilla. Tenderness - Non Tender. Femoral & Inguinal  Generalized Femoral & Inguinal Lymphatics: Bilateral - Description - Normal. Tenderness - Non Tender.    Assessment & Plan BREAST CANCER, FEMALE (C50.919) Impression: I had a long discussion with the patient and her son  regarding her diagnosis. Again we're uncertain of location of the primary breast cancer. It may represent the area of the complex sclerosing lesion. An MRI has been recommended to evaluate the extent of her disease of the breast. We will schedule this and we'll also refer her to medical and radiation oncology. The family has requested the pathology from the lymph node be sent to [facility] for another opinion. We will determine where to proceed after the MRI is completed.  Addendum:  After her MRI and consult from oncology, she will undergo neoadjuvant  Chemotherapy.  Plan will be to place a port a cath for IV access for chemo. I discussed the risks of the procedure which include but are not limited to bleeding, infection, injury to surrounding structures, pneumothorax, etc.  She agrees to proceed.

## 2016-03-21 ENCOUNTER — Inpatient Hospital Stay (HOSPITAL_COMMUNITY): Payer: BLUE CROSS/BLUE SHIELD

## 2016-03-21 ENCOUNTER — Ambulatory Visit (HOSPITAL_BASED_OUTPATIENT_CLINIC_OR_DEPARTMENT_OTHER): Payer: BLUE CROSS/BLUE SHIELD | Admitting: Anesthesiology

## 2016-03-21 ENCOUNTER — Ambulatory Visit (HOSPITAL_COMMUNITY): Payer: BLUE CROSS/BLUE SHIELD

## 2016-03-21 ENCOUNTER — Other Ambulatory Visit: Payer: Self-pay

## 2016-03-21 ENCOUNTER — Ambulatory Visit (HOSPITAL_COMMUNITY): Payer: BLUE CROSS/BLUE SHIELD | Admitting: Anesthesiology

## 2016-03-21 ENCOUNTER — Encounter (HOSPITAL_BASED_OUTPATIENT_CLINIC_OR_DEPARTMENT_OTHER): Payer: Self-pay | Admitting: *Deleted

## 2016-03-21 ENCOUNTER — Inpatient Hospital Stay (HOSPITAL_BASED_OUTPATIENT_CLINIC_OR_DEPARTMENT_OTHER)
Admission: AD | Admit: 2016-03-21 | Discharge: 2016-03-26 | DRG: 186 | Disposition: A | Discharge status: Home / Self Care | Payer: BLUE CROSS/BLUE SHIELD | Source: Ambulatory Visit | Attending: Surgery | Admitting: Surgery

## 2016-03-21 ENCOUNTER — Encounter (HOSPITAL_COMMUNITY)
Admission: AD | Disposition: A | Discharge status: Home / Self Care | Payer: Self-pay | Source: Ambulatory Visit | Attending: Surgery

## 2016-03-21 DIAGNOSIS — Z0189 Encounter for other specified special examinations: Secondary | ICD-10-CM

## 2016-03-21 DIAGNOSIS — Z419 Encounter for procedure for purposes other than remedying health state, unspecified: Secondary | ICD-10-CM

## 2016-03-21 DIAGNOSIS — J942 Hemothorax: Secondary | ICD-10-CM | POA: Diagnosis present

## 2016-03-21 DIAGNOSIS — Z978 Presence of other specified devices: Secondary | ICD-10-CM | POA: Diagnosis not present

## 2016-03-21 DIAGNOSIS — C50912 Malignant neoplasm of unspecified site of left female breast: Secondary | ICD-10-CM | POA: Diagnosis present

## 2016-03-21 DIAGNOSIS — K219 Gastro-esophageal reflux disease without esophagitis: Secondary | ICD-10-CM | POA: Diagnosis present

## 2016-03-21 DIAGNOSIS — D62 Acute posthemorrhagic anemia: Secondary | ICD-10-CM | POA: Diagnosis present

## 2016-03-21 DIAGNOSIS — R079 Chest pain, unspecified: Secondary | ICD-10-CM | POA: Diagnosis present

## 2016-03-21 DIAGNOSIS — R739 Hyperglycemia, unspecified: Secondary | ICD-10-CM | POA: Diagnosis present

## 2016-03-21 DIAGNOSIS — J9601 Acute respiratory failure with hypoxia: Secondary | ICD-10-CM | POA: Diagnosis not present

## 2016-03-21 DIAGNOSIS — S25199A Other specified injury of unspecified innominate or subclavian artery, initial encounter: Secondary | ICD-10-CM | POA: Diagnosis not present

## 2016-03-21 DIAGNOSIS — J9600 Acute respiratory failure, unspecified whether with hypoxia or hypercapnia: Secondary | ICD-10-CM | POA: Diagnosis present

## 2016-03-21 DIAGNOSIS — R578 Other shock: Secondary | ICD-10-CM | POA: Diagnosis present

## 2016-03-21 DIAGNOSIS — Z9689 Presence of other specified functional implants: Secondary | ICD-10-CM

## 2016-03-21 DIAGNOSIS — J939 Pneumothorax, unspecified: Secondary | ICD-10-CM

## 2016-03-21 HISTORY — PX: PORT-A-CATH REMOVAL: SHX5289

## 2016-03-21 HISTORY — PX: PORTACATH PLACEMENT: SHX2246

## 2016-03-21 HISTORY — PX: CHEST TUBE INSERTION: SHX231

## 2016-03-21 LAB — COMPREHENSIVE METABOLIC PANEL
ALBUMIN: 3.1 g/dL — AB (ref 3.5–5.0)
ALK PHOS: 37 U/L — AB (ref 38–126)
ALT: 12 U/L — AB (ref 14–54)
ANION GAP: 6 (ref 5–15)
AST: 15 U/L (ref 15–41)
BUN: 7 mg/dL (ref 6–20)
CALCIUM: 8.2 mg/dL — AB (ref 8.9–10.3)
CHLORIDE: 110 mmol/L (ref 101–111)
CO2: 25 mmol/L (ref 22–32)
CREATININE: 0.57 mg/dL (ref 0.44–1.00)
GFR calc Af Amer: >60 mL/min (ref >60)
GFR calc non Af Amer: >60 mL/min (ref >60)
GLUCOSE: 165 mg/dL — AB (ref 65–99)
Potassium: 3.7 mmol/L (ref 3.5–5.1)
SODIUM: 141 mmol/L (ref 135–145)
Total Bilirubin: 0.9 mg/dL (ref 0.3–1.2)
Total Protein: 4.4 g/dL — ABNORMAL LOW (ref 6.5–8.1)

## 2016-03-21 LAB — BASIC METABOLIC PANEL
Anion gap: 9 (ref 5–15)
BUN: 5 mg/dL — ABNORMAL LOW (ref 6–20)
CHLORIDE: 108 mmol/L (ref 101–111)
CO2: 24 mmol/L (ref 22–32)
CREATININE: 0.58 mg/dL (ref 0.44–1.00)
Calcium: 8.1 mg/dL — ABNORMAL LOW (ref 8.9–10.3)
GFR calc non Af Amer: >60 mL/min (ref >60)
Glucose, Bld: 164 mg/dL — ABNORMAL HIGH (ref 65–99)
POTASSIUM: 3.5 mmol/L (ref 3.5–5.1)
SODIUM: 141 mmol/L (ref 135–145)

## 2016-03-21 LAB — CBC
HEMATOCRIT: 35.4 % — AB (ref 36.0–46.0)
HEMATOCRIT: 34.7 % — AB (ref 36.0–46.0)
HEMOGLOBIN: 11.9 g/dL — AB (ref 12.0–15.0)
Hemoglobin: 11.6 g/dL — ABNORMAL LOW (ref 12.0–15.0)
MCH: 28.3 pg (ref 26.0–34.0)
MCH: 28 pg (ref 26.0–34.0)
MCHC: 33.6 g/dL (ref 30.0–36.0)
MCHC: 33.4 g/dL (ref 30.0–36.0)
MCV: 83.6 fL (ref 78.0–100.0)
MCV: 84.3 fL (ref 78.0–100.0)
Platelets: 74 10*3/uL — ABNORMAL LOW (ref 150–400)
Platelets: 92 10*3/uL — ABNORMAL LOW (ref 150–400)
RBC: 4.15 MIL/uL (ref 3.87–5.11)
RBC: 4.2 MIL/uL (ref 3.87–5.11)
RDW: 15.5 % (ref 11.5–15.5)
RDW: 15.3 % (ref 11.5–15.5)
WBC: 12.1 10*3/uL — ABNORMAL HIGH (ref 4.0–10.5)
WBC: 14 10*3/uL — ABNORMAL HIGH (ref 4.0–10.5)

## 2016-03-21 LAB — MRSA PCR SCREENING: MRSA BY PCR: NEGATIVE

## 2016-03-21 LAB — BLOOD GAS, ARTERIAL
Acid-base deficit: 0 mmol/L (ref 0.0–2.0)
BICARBONATE: 23.5 mmol/L (ref 20.0–28.0)
Drawn by: 313941
FIO2: 1
LHR: 16 {breaths}/min
O2 Saturation: 99.9 %
PEEP: 5 cmH2O
PO2 ART: 391 mmHg — AB (ref 83.0–108.0)
Patient temperature: 97.1
VT: 510 mL
pCO2 arterial: 32.6 mmHg (ref 32.0–48.0)
pH, Arterial: 7.466 — ABNORMAL HIGH (ref 7.350–7.450)

## 2016-03-21 LAB — POCT I-STAT, CHEM 8
BUN: 6 mg/dL (ref 6–20)
CALCIUM ION: 1.13 mmol/L — AB (ref 1.15–1.40)
CHLORIDE: 101 mmol/L (ref 101–111)
Creatinine, Ser: 0.5 mg/dL (ref 0.44–1.00)
Glucose, Bld: 328 mg/dL — ABNORMAL HIGH (ref 65–99)
HEMATOCRIT: 18 % — AB (ref 36.0–46.0)
Hemoglobin: 6.1 g/dL — CL (ref 12.0–15.0)
POTASSIUM: 3.6 mmol/L (ref 3.5–5.1)
SODIUM: 137 mmol/L (ref 135–145)
TCO2: 21 mmol/L (ref 0–100)

## 2016-03-21 LAB — GLUCOSE, CAPILLARY
GLUCOSE-CAPILLARY: 133 mg/dL — AB (ref 65–99)
GLUCOSE-CAPILLARY: 152 mg/dL — AB (ref 65–99)
Glucose-Capillary: 134 mg/dL — ABNORMAL HIGH (ref 65–99)

## 2016-03-21 LAB — TRIGLYCERIDES: Triglycerides: 42 mg/dL (ref <150)

## 2016-03-21 LAB — HEPATIC FUNCTION PANEL
ALBUMIN: 3.1 g/dL — AB (ref 3.5–5.0)
ALT: 12 U/L — ABNORMAL LOW (ref 14–54)
AST: 15 U/L (ref 15–41)
Alkaline Phosphatase: 39 U/L (ref 38–126)
BILIRUBIN TOTAL: 1.5 mg/dL — AB (ref 0.3–1.2)
Bilirubin, Direct: 0.2 mg/dL (ref 0.1–0.5)
Indirect Bilirubin: 1.3 mg/dL — ABNORMAL HIGH (ref 0.3–0.9)
Total Protein: 4.5 g/dL — ABNORMAL LOW (ref 6.5–8.1)

## 2016-03-21 LAB — PROTIME-INR
INR: 1.43
PROTHROMBIN TIME: 17.6 s — AB (ref 11.4–15.2)

## 2016-03-21 LAB — APTT: aPTT: 30 seconds (ref 24–36)

## 2016-03-21 LAB — ABO/RH: ABO/RH(D): O POS

## 2016-03-21 LAB — LACTIC ACID, PLASMA: LACTIC ACID, VENOUS: 1.3 mmol/L (ref 0.5–1.9)

## 2016-03-21 SURGERY — INSERTION, TUNNELED CENTRAL VENOUS DEVICE, WITH PORT
Anesthesia: General | Site: Chest | Laterality: Right

## 2016-03-21 SURGERY — VIDEO ASSISTED THORACOSCOPY (VATS)/DECORTICATION
Anesthesia: General | Site: Chest

## 2016-03-21 MED ORDER — ATROPINE SULFATE 0.4 MG/ML IV SOSY
PREFILLED_SYRINGE | INTRAVENOUS | Status: DC | PRN
  Administered 2016-03-21: .4 mg via INTRAVENOUS

## 2016-03-21 MED ORDER — FENTANYL BOLUS VIA INFUSION
50.0000 ug | INTRAVENOUS | Status: DC | PRN
  Filled 2016-03-21: qty 50

## 2016-03-21 MED ORDER — HEPARIN SOD (PORK) LOCK FLUSH 100 UNIT/ML IV SOLN
INTRAVENOUS | Status: AC
  Filled 2016-03-21: qty 10

## 2016-03-21 MED ORDER — CHLORHEXIDINE GLUCONATE 0.12% ORAL RINSE (MEDLINE KIT)
15.0000 mL | Freq: Two times a day (BID) | OROMUCOSAL | Status: DC
  Administered 2016-03-21 – 2016-03-22 (×3): 15 mL via OROMUCOSAL

## 2016-03-21 MED ORDER — CHLORHEXIDINE GLUCONATE CLOTH 2 % EX PADS: 6.0000 | MEDICATED_PAD | Freq: Once | CUTANEOUS | Status: DC

## 2016-03-21 MED ORDER — MIDAZOLAM HCL 2 MG/2ML IJ SOLN
1.0000 mg | INTRAMUSCULAR | Status: DC | PRN
  Administered 2016-03-21: 1 mg via INTRAVENOUS
  Administered 2016-03-21: 3 mg via INTRAVENOUS
  Administered 2016-03-21 (×2): 1 mg via INTRAVENOUS

## 2016-03-21 MED ORDER — CEFAZOLIN SODIUM-DEXTROSE 2-4 GM/100ML-% IV SOLN
INTRAVENOUS | Status: AC
  Filled 2016-03-21: qty 100

## 2016-03-21 MED ORDER — ALBUMIN HUMAN 5 % IV SOLN
INTRAVENOUS | Status: DC | PRN
  Administered 2016-03-21 (×2): via INTRAVENOUS

## 2016-03-21 MED ORDER — MIDAZOLAM HCL 2 MG/2ML IJ SOLN
INTRAMUSCULAR | Status: AC
  Filled 2016-03-21: qty 2

## 2016-03-21 MED ORDER — DEXAMETHASONE SODIUM PHOSPHATE 4 MG/ML IJ SOLN
INTRAMUSCULAR | Status: DC | PRN
  Administered 2016-03-21: 10 mg via INTRAVENOUS

## 2016-03-21 MED ORDER — FAMOTIDINE IN NACL 20-0.9 MG/50ML-% IV SOLN
20.0000 mg | Freq: Two times a day (BID) | INTRAVENOUS | Status: DC
  Administered 2016-03-21 – 2016-03-24 (×5): 20 mg via INTRAVENOUS
  Filled 2016-03-21 (×5): qty 50

## 2016-03-21 MED ORDER — PHENYLEPHRINE 40 MCG/ML (10ML) SYRINGE FOR IV PUSH (FOR BLOOD PRESSURE SUPPORT)
PREFILLED_SYRINGE | INTRAVENOUS | Status: DC | PRN
  Administered 2016-03-21 (×3): 80 ug via INTRAVENOUS

## 2016-03-21 MED ORDER — DEXMEDETOMIDINE HCL IN NACL 200 MCG/50ML IV SOLN
0.0000 ug/kg/h | INTRAVENOUS | Status: DC
  Administered 2016-03-21: 0.6 ug/kg/h via INTRAVENOUS

## 2016-03-21 MED ORDER — HEPARIN (PORCINE) IN NACL 2-0.9 UNIT/ML-% IJ SOLN
INTRAMUSCULAR | Status: DC | PRN
  Administered 2016-03-21 (×2): 1 via INTRAVENOUS

## 2016-03-21 MED ORDER — LIDOCAINE HCL (PF) 1 % IJ SOLN
INTRAMUSCULAR | Status: DC | PRN
  Administered 2016-03-21: 7 mL

## 2016-03-21 MED ORDER — PROPOFOL 10 MG/ML IV BOLUS
INTRAVENOUS | Status: DC | PRN
  Administered 2016-03-21 (×2): 20 mg via INTRAVENOUS

## 2016-03-21 MED ORDER — SODIUM CHLORIDE 0.9 % IV SOLN
0.0000 ug/min | INTRAVENOUS | Status: DC
  Administered 2016-03-21: 5 ug/min via INTRAVENOUS
  Filled 2016-03-21 (×2): qty 1

## 2016-03-21 MED ORDER — HEPARIN SOD (PORK) LOCK FLUSH 100 UNIT/ML IV SOLN
INTRAVENOUS | Status: DC | PRN
  Administered 2016-03-21: 500 [IU] via INTRAVENOUS

## 2016-03-21 MED ORDER — CEFAZOLIN SODIUM-DEXTROSE 2-4 GM/100ML-% IV SOLN
2.0000 g | Freq: Three times a day (TID) | INTRAVENOUS | Status: AC
  Administered 2016-03-21 – 2016-03-22 (×3): 2 g via INTRAVENOUS
  Filled 2016-03-21 (×4): qty 100

## 2016-03-21 MED ORDER — INSULIN ASPART 100 UNIT/ML ~~LOC~~ SOLN
0.0000 [IU] | SUBCUTANEOUS | Status: DC
  Administered 2016-03-21: 1 [IU] via SUBCUTANEOUS
  Administered 2016-03-21: 2 [IU] via SUBCUTANEOUS
  Administered 2016-03-22 (×2): 1 [IU] via SUBCUTANEOUS
  Administered 2016-03-22: 2 [IU] via SUBCUTANEOUS
  Administered 2016-03-22 (×4): 1 [IU] via SUBCUTANEOUS
  Administered 2016-03-23: 2 [IU] via SUBCUTANEOUS
  Administered 2016-03-23 – 2016-03-24 (×2): 1 [IU] via SUBCUTANEOUS

## 2016-03-21 MED ORDER — SODIUM CHLORIDE 0.9 % IV SOLN
INTRAVENOUS | Status: DC | PRN
  Administered 2016-03-21: 40 mL

## 2016-03-21 MED ORDER — FENTANYL CITRATE (PF) 100 MCG/2ML IJ SOLN
25.0000 ug | INTRAMUSCULAR | Status: DC | PRN
  Administered 2016-03-22: 25 ug via INTRAVENOUS
  Filled 2016-03-21: qty 2

## 2016-03-21 MED ORDER — EPHEDRINE SULFATE-NACL 50-0.9 MG/10ML-% IV SOSY
PREFILLED_SYRINGE | INTRAVENOUS | Status: DC | PRN
  Administered 2016-03-21: 10 mg via INTRAVENOUS

## 2016-03-21 MED ORDER — SCOPOLAMINE 1 MG/3DAYS TD PT72: 1.0000 | MEDICATED_PATCH | Freq: Once | TRANSDERMAL | Status: DC | PRN

## 2016-03-21 MED ORDER — LACTATED RINGERS IV SOLN
INTRAVENOUS | Status: DC
  Administered 2016-03-21 (×10): via INTRAVENOUS

## 2016-03-21 MED ORDER — DEXMEDETOMIDINE HCL IN NACL 200 MCG/50ML IV SOLN
INTRAVENOUS | Status: DC | PRN
  Administered 2016-03-21: .7 ug/kg/h via INTRAVENOUS
  Administered 2016-03-21: 0.6 ug/kg/h
  Administered 2016-03-21: 15:00:00

## 2016-03-21 MED ORDER — LIDOCAINE 2% (20 MG/ML) 5 ML SYRINGE
INTRAMUSCULAR | Status: DC | PRN
  Administered 2016-03-21: 40 mg via INTRAVENOUS

## 2016-03-21 MED ORDER — ORAL CARE MOUTH RINSE
15.0000 mL | Freq: Four times a day (QID) | OROMUCOSAL | Status: DC
  Administered 2016-03-22 – 2016-03-23 (×3): 15 mL via OROMUCOSAL

## 2016-03-21 MED ORDER — ROCURONIUM BROMIDE 10 MG/ML (PF) SYRINGE
PREFILLED_SYRINGE | INTRAVENOUS | Status: DC | PRN
  Administered 2016-03-21: 50 mg via INTRAVENOUS
  Administered 2016-03-21: 40 mg via INTRAVENOUS

## 2016-03-21 MED ORDER — FENTANYL CITRATE (PF) 100 MCG/2ML IJ SOLN
50.0000 ug | INTRAMUSCULAR | Status: DC | PRN
  Administered 2016-03-21 (×2): 50 ug via INTRAVENOUS

## 2016-03-21 MED ORDER — PROPOFOL 1000 MG/100ML IV EMUL: 0.0000 ug/kg/min | INTRAVENOUS | Status: DC

## 2016-03-21 MED ORDER — SODIUM CHLORIDE 0.9 % IV SOLN
INTRAVENOUS | Status: DC
  Administered 2016-03-21: 17:00:00 via INTRAVENOUS

## 2016-03-21 MED ORDER — FENTANYL CITRATE (PF) 100 MCG/2ML IJ SOLN: 50.0000 ug | Freq: Once | INTRAMUSCULAR | Status: DC

## 2016-03-21 MED ORDER — SODIUM CHLORIDE 0.9 % IV SOLN
25.0000 ug/h | INTRAVENOUS | Status: DC
  Administered 2016-03-21: 25 ug/h via INTRAVENOUS
  Filled 2016-03-21: qty 50

## 2016-03-21 MED ORDER — POTASSIUM CHLORIDE IN NACL 20-0.9 MEQ/L-% IV SOLN
INTRAVENOUS | Status: DC
  Administered 2016-03-21 – 2016-03-22 (×2): via INTRAVENOUS
  Administered 2016-03-23: 75 mL/h via INTRAVENOUS
  Administered 2016-03-23: 17:00:00 via INTRAVENOUS
  Administered 2016-03-24: 75 mL/h via INTRAVENOUS
  Filled 2016-03-21 (×6): qty 1000

## 2016-03-21 MED ORDER — CEFAZOLIN SODIUM-DEXTROSE 2-4 GM/100ML-% IV SOLN
2.0000 g | INTRAVENOUS | Status: AC
  Administered 2016-03-21: 2 g via INTRAVENOUS

## 2016-03-21 MED ORDER — PROTAMINE SULFATE 10 MG/ML IV SOLN
INTRAVENOUS | Status: DC | PRN
  Administered 2016-03-21: 30 mg via INTRAVENOUS

## 2016-03-21 MED ORDER — SODIUM CHLORIDE 0.9 % IV SOLN: 250.0000 mL | INTRAVENOUS | Status: DC | PRN

## 2016-03-21 MED ORDER — DEXTROSE 5 % IV SOLN
INTRAVENOUS | Status: DC | PRN
  Administered 2016-03-21: 50 ug/min via INTRAVENOUS

## 2016-03-21 SURGICAL SUPPLY — 52 items
BAG DECANTER FOR FLEXI CONT (MISCELLANEOUS) ×4 IMPLANT
BLADE HEX COATED 2.75 (ELECTRODE) ×2 IMPLANT
BLADE SURG 15 STRL LF DISP TIS (BLADE) ×2 IMPLANT
BLADE SURG 15 STRL SS (BLADE) ×2
CANISTER SUCT 1200ML W/VALVE (MISCELLANEOUS) ×2 IMPLANT
CATH TROCAR 32FR (CATHETERS) ×2 IMPLANT
CHLORAPREP W/TINT 26ML (MISCELLANEOUS) ×2 IMPLANT
COVER BACK TABLE 60X90IN (DRAPES) ×4 IMPLANT
COVER MAYO STAND STRL (DRAPES) ×4 IMPLANT
DECANTER SPIKE VIAL GLASS SM (MISCELLANEOUS) IMPLANT
DERMABOND ADVANCED (GAUZE/BANDAGES/DRESSINGS) ×2
DERMABOND ADVANCED .7 DNX12 (GAUZE/BANDAGES/DRESSINGS) ×2 IMPLANT
DRAPE C-ARM 42X72 X-RAY (DRAPES) ×4 IMPLANT
DRAPE LAPAROSCOPIC ABDOMINAL (DRAPES) ×2 IMPLANT
DRAPE LAPAROTOMY 100X72 PEDS (DRAPES) ×2 IMPLANT
DRAPE UTILITY XL STRL (DRAPES) ×2 IMPLANT
DRSG TEGADERM 4X4.75 (GAUZE/BANDAGES/DRESSINGS) ×8 IMPLANT
ELECT REM PT RETURN 9FT ADLT (ELECTROSURGICAL) ×2
ELECTRODE REM PT RTRN 9FT ADLT (ELECTROSURGICAL) ×1 IMPLANT
GLOVE BIO SURGEON STRL SZ7.5 (GLOVE) ×2 IMPLANT
GLOVE BIOGEL PI IND STRL 7.0 (GLOVE) ×6 IMPLANT
GLOVE BIOGEL PI INDICATOR 7.0 (GLOVE) ×6
GLOVE ECLIPSE 6.5 STRL STRAW (GLOVE) ×6 IMPLANT
GLOVE SURG SIGNA 7.5 PF LTX (GLOVE) ×8 IMPLANT
GOWN STRL REUS W/ TWL LRG LVL3 (GOWN DISPOSABLE) ×2 IMPLANT
GOWN STRL REUS W/ TWL XL LVL3 (GOWN DISPOSABLE) ×3 IMPLANT
GOWN STRL REUS W/TWL LRG LVL3 (GOWN DISPOSABLE) ×2
GOWN STRL REUS W/TWL XL LVL3 (GOWN DISPOSABLE) ×3
IV KIT MINILOC 20X1 SAFETY (NEEDLE) ×2 IMPLANT
KIT PORT POWER 8FR ISP CVUE (Catheter) ×2 IMPLANT
NEEDLE HYPO 25X1 1.5 SAFETY (NEEDLE) ×2 IMPLANT
PACK BASIN DAY SURGERY FS (CUSTOM PROCEDURE TRAY) ×2 IMPLANT
PENCIL BUTTON HOLSTER BLD 10FT (ELECTRODE) ×2 IMPLANT
SLEEVE SCD COMPRESS KNEE MED (MISCELLANEOUS) ×2 IMPLANT
SPONGE GAUZE 2X2 8PLY STRL LF (GAUZE/BANDAGES/DRESSINGS) ×4 IMPLANT
SPONGE LAP 18X18 X RAY DECT (DISPOSABLE) ×2 IMPLANT
SUT MNCRL AB 4-0 PS2 18 (SUTURE) ×4 IMPLANT
SUT PROLENE 2 0 SH DA (SUTURE) ×2 IMPLANT
SUT SILK 2 0 TIES 17X18 (SUTURE)
SUT SILK 2-0 18XBRD TIE BLK (SUTURE) IMPLANT
SUT VIC AB 3-0 SH 27 (SUTURE) ×2
SUT VIC AB 3-0 SH 27X BRD (SUTURE) ×2 IMPLANT
SYR 20CC LL (SYRINGE) ×2 IMPLANT
SYR BULB IRRIGATION 50ML (SYRINGE) ×2 IMPLANT
SYR CONTROL 10ML LL (SYRINGE) ×2 IMPLANT
SYSTEM SAHARA CHEST DRAIN ATS (WOUND CARE) ×2 IMPLANT
TOWEL OR 17X24 6PK STRL BLUE (TOWEL DISPOSABLE) ×6 IMPLANT
TOWEL OR NON WOVEN STRL DISP B (DISPOSABLE) ×2 IMPLANT
TRAY DSU PREP LF (CUSTOM PROCEDURE TRAY) ×2 IMPLANT
TRAY FOLEY CATH 14FR (SET/KITS/TRAYS/PACK) ×2 IMPLANT
TUBE CONNECTING 20X1/4 (TUBING) ×2 IMPLANT
YANKAUER SUCT BULB TIP NO VENT (SUCTIONS) IMPLANT

## 2016-03-21 NOTE — Consult Note (Signed)
PULMONARY / CRITICAL CARE MEDICINE   Name: Jacqueline Avery MRN: [id number] DOB: 1965/12/31    ADMISSION DATE:  03/21/2016 CONSULTATION DATE:  03/21/16  REFERRING MD:  Dr. Ninfa Linden  CHIEF COMPLAINT:  Hemothorax  HISTORY OF PRESENT ILLNESS:   51 y/o F with recent diagnosis of breast cancer underwent port-a-cath placement procedure on 2/8.  Following the procedure, the pt was hypotensive with decreased breath sounds on the R. An emergent chest tube was placed and 1500 mL of bloody fluid was drained. The port-a-cath was backed out with venograms to assess placement and eventually removed. She had an H/H of 6.1 and 18, and was given 4 units of blood. She required neo and fem line placement in OR.  Currently she is on the ventilator with a R sided chest tube in. She is on pressors for blood pressure, and precedex for agitation. RN reports she has had extensive UOP. Neo is required low low dose  PCCM to consult for assistance in vent and hemodynamic management.    PAST MEDICAL HISTORY :  Breast Ca She  has no past medical history on file.  PAST SURGICAL HISTORY: She  has no past surgical history on file.  No Known Allergies  No current facility-administered medications on file prior to encounter.    Current Outpatient Prescriptions on File Prior to Encounter  Medication Sig  . b complex vitamins capsule Take 1 capsule by mouth daily.  . TURMERIC PO Take 1 capsule by mouth daily.     FAMILY HISTORY:  Her has no family status information on file.  Husband in [place]: She  reports that she has never smoked. She has never used smokeless tobacco. She reports that she does not drink alcohol or use drugs.  SUBJECTIVE:  RN reports pt remains sedated on vent. When aroused, she becomes agitated.    VITAL SIGNS: BP 137/90   Pulse (!) 56   Temp 97.8 F (36.6 C) (Oral)   Resp 18   Ht 5\' 6"  (1.676 m)   Wt 146 lb 6.4 oz (66.4 kg)   LMP 03/05/2016 (Exact Date)   SpO2  100%   BMI 23.63 kg/m   HEMODYNAMICS:    VENTILATOR SETTINGS:    INTAKE / OUTPUT: No intake/output data recorded.  PHYSICAL EXAMINATION: General:  Adult F in NAD on vent Neuro:  Sedated. Will arouse to voice and become agitated, but does not follow commands HEENT:  NCAT. PERRL. ETT in place. MM moist Cardiovascular:  Bradycardic with regular rhythm. No m/r/g Lungs:  Even/non labored on vent. R lung sounds coarse, L lungs clear Abdomen:  +BS. Soft, nontender Musculoskeletal:  No edema Skin:  Warm, dry, intact  LABS:  BMET  Recent Labs Lab 03/21/16 1250  NA 137  K 3.6  CL 101  BUN 6  CREATININE 0.50  GLUCOSE 328*    Electrolytes No results for input(s): CALCIUM, MG, PHOS in the last 168 hours.  CBC  Recent Labs Lab 03/21/16 1250  HGB 6.1*  HCT 18.0*    Coag's No results for input(s): APTT, INR in the last 168 hours.  Sepsis Markers No results for input(s): LATICACIDVEN, PROCALCITON, O2SATVEN in the last 168 hours.  ABG No results for input(s): PHART, PCO2ART, PO2ART in the last 168 hours.  Liver Enzymes No results for input(s): AST, ALT, ALKPHOS, BILITOT, ALBUMIN in the last 168 hours.  Cardiac Enzymes No results for input(s): TROPONINI, PROBNP in the last 168 hours.  Glucose No results  for input(s): GLUCAP in the last 168 hours.  Imaging Dg Chest Port 1 View  Result Date: 03/21/2016 CLINICAL DATA:  Chest tube in place EXAM: PORTABLE CHEST 1 VIEW COMPARISON:  03/05/2016 chest CT FINDINGS: There is a subclavian porta catheter on the right. Tip position is right of the trachea, but location is uncertain given the constellation of findings. There is a large pleural/subpleural opacity at the right apex. Mediastinum is shifted to the left. A chest tube is present, tip overlapping the mid medial right chest. There may be small pneumothorax in the right costophrenic sulcus. Endotracheal tube with tip at the clavicular heads. Normal heart size. Critical  Value/emergent results were called by telephone at the time of interpretation on 03/21/2016 at 12:46 pm to Dr. Coralie Keens , who is aware of the findings and currently resuscitating the patient. Presumed large vessel bleeding. IMPRESSION: 1. Large right apical hemothorax/extrapleural hematoma with mediastinal shift. 2. Right-sided chest tube overlaps the mid chest. 3. Endotracheal tube in good position. 4. Right-sided porta catheter. Electronically Signed   By: Monte Fantasia M.D.   On: 03/21/2016 12:53   Dg Fluoro Guide Cv Line-no Report  Result Date: 03/21/2016 Fluoroscopy was utilized by the requesting physician.  No radiographic interpretation.    STUDIES:  CXR 2/8  CULTURES:   ANTIBIOTICS:   SIGNIFICANT EVENTS: 2/08 port-a-cath placement then removed with complication hemothorax [id number] admit with hemothorax on vent, shock  LINES/TUBES: ETT 2/08 >> Fem line (in OR ) 2/8>>>  DISCUSSION: 51 y/o F with hemothorax s/p port-a-cath placement. 1500 mL bloody fluid removed from chest.  ASSESSMENT / PLAN:  PULMONARY A: Rt hemothorax Acute resp failure At risk ALI rt lung P:   PRVC  At 8 cc/kg Wean for sats > 92% Vent mangement per protocol CXR now after transfer to icu bed ABG now on 8 cc/kg, peep 5, 100%, likely can reduce rapid to 40% ETT will remain in overnight to observe blood output from chest tube, re assuring now with limited output CVTS following Provide MV 12- liters or so now as we assess lactic acid level See heme  CARDIOVASCULAR A:  Bradycardia (precedex) Hemorrhagic shock, likely have residual vasoplegia Pos pressure ventilation contribution to shock P:  Trend CBC Wean pressors for MAP >65 on neo Would avoid further crystalloid resus, would opt for colloid/ prbc when needed, see heme F/u on lactic acid now We will resus with products Cortisol if BP remains on neo for hours x 4 May need cvp and line neck, will avoid with events noted No cvp fem , dc  fem asap in am  NO ALINE unless pressors worsen If remains or worsening neo, will add vaso  RENAL A:   Bleeding, shock P:   Monitor UOP bmet now Saline at 50 to kvo in am   GASTROINTESTINAL A:   At risk shock liver P:   On vent add pepcid No feeding overnight Get lft  HEMATOLOGIC A:   Hemodynamic shock dvt prevention Hemothorax Risk consumptive and dilutional coagulapthy P:  Trend CBC Transfuse for hgb < 7 as no further bleeding noted on chest tube output With rapid blood loss 1,5 liters not sure where she will equilibrate, cbc q4h coags now and may need in am  SCDs for DVT prophlyaxis  INFECTIOUS A:   Peri op risk P:   Peri op abx given ,dc  ENDOCRINE A:   Hyperglycemia  May need to r/o rel AI P:   Per primary  NEUROLOGIC A:  Sedated on ventilator P:   RASS goal: -1 Restraints for safety Continue precedex but is neo up or not off over 4 hours will traniition her off to fent drip and prn versed   PCCM to remain as consult for vent and hemodynamic management.    FAMILY  - Updates: Husband updated by RN per phone  - Inter-disciplinary family meet or Palliative Care meeting due by:  2/15  Ccm time 35 min   Lavon Paganini. Titus Mould, MD, [place] Pgr: [facility]  Pulmonary and [place] Pager: [phone]  03/21/2016, 3:16 PM

## 2016-03-21 NOTE — Anesthesia Procedure Notes (Signed)
Procedure Name: LMA Insertion Date/Time: 03/21/2016 11:28 AM Performed by: Lyndee Leo Pre-anesthesia Checklist: Patient identified, Emergency Drugs available, Suction available and Patient being monitored Patient Re-evaluated:Patient Re-evaluated prior to inductionOxygen Delivery Method: Circle system utilized Preoxygenation: Pre-oxygenation with 100% oxygen Intubation Type: IV induction Ventilation: Mask ventilation without difficulty LMA: LMA inserted LMA Size: 4.0 Number of attempts: 1 Airway Equipment and Method: Bite block Placement Confirmation: positive ETCO2 Tube secured with: Tape Dental Injury: Teeth and Oropharynx as per pre-operative assessment

## 2016-03-21 NOTE — OR Nursing (Signed)
After Port site closed, pt's BP dropped.  Decision made to place chest tube, Size 49fr, right chest.  Chest tube initially connected to suction with 757ml of blood removed, then connected to Pleurovac and another 875ml of blood obtained.   Call made to thoracic surgeon, Dr. Roxan Hockey.  He came @ 1220, scrubbed in and performed venogram, then removed port a cath.  [facility] will transport pt to ICU.  Family friend and pt's husband spoken to by Sherron Monday and later Dr. Ninfa Linden.

## 2016-03-21 NOTE — Transfer of Care (Signed)
Immediate Anesthesia Transfer of Care Note  Patient: Jacqueline Avery  Procedure(s) Performed: Procedure(s): INSERTION PORT-A-CATH (Right) CHEST TUBE INSERTION (Right) REMOVAL PORT-A-CATH (Right) VENOGRAM (Right)  Patient Location: SICU  Anesthesia Type:General  Level of Consciousness: sedated  Airway & Oxygen Therapy: Patient placed on Ventilator (see vital sign flow sheet for setting)  Post-op Assessment: Report given to RN and Post -op Vital signs reviewed and stable  Post vital signs: Reviewed and stable  Last Vitals:  Vitals:   03/21/16 1450 03/21/16 1500  BP: (!) 125/99 137/90  Pulse: (!) 57 (!) 56  Resp: 16 18  Temp:      Last Pain:  Vitals:   03/21/16 1035  TempSrc: Oral  PainSc: 0-No pain         Complications: No apparent anesthesia complications

## 2016-03-21 NOTE — Progress Notes (Signed)
Patient ID: Jacqueline Avery, female   DOB: 01-30-1966, 51 y.o.   MRN: [id number]  Patient stable in the ICU. Minimal chest tube output now.  Bleeding appears to have stopped. I have updated the patient's husband and son extensively by phone the events in the OR and her current condition.  Appreciate CVTS and CCM's rapid response.

## 2016-03-21 NOTE — Anesthesia Preprocedure Evaluation (Addendum)
Anesthesia Evaluation  Patient identified by MRN, date of birth, ID band Patient awake    Reviewed: Allergy & Precautions, H&P , NPO status , Patient's Chart, lab work & pertinent test results  Airway Mallampati: II   Neck ROM: full    Dental   Pulmonary neg pulmonary ROS,    breath sounds clear to auscultation       Cardiovascular negative cardio ROS   Rhythm:regular Rate:Normal     Neuro/Psych    GI/Hepatic GERD  ,  Endo/Other    Renal/GU      Musculoskeletal   Abdominal   Peds  Hematology   Anesthesia Other Findings   Reproductive/Obstetrics Breast CA                             Anesthesia Physical Anesthesia Plan  ASA: II  Anesthesia Plan: MAC   Post-op Pain Management:    Induction: Intravenous  Airway Management Planned: Simple Face Mask  Additional Equipment:   Intra-op Plan:   Post-operative Plan:   Informed Consent: I have reviewed the patients History and Physical, chart, labs and discussed the procedure including the risks, benefits and alternatives for the proposed anesthesia with the patient or authorized representative who has indicated his/her understanding and acceptance.     Plan Discussed with: CRNA, Anesthesiologist and Surgeon  Anesthesia Plan Comments:        Anesthesia Quick Evaluation

## 2016-03-21 NOTE — Anesthesia Preprocedure Evaluation (Deleted)
Anesthesia Evaluation  Patient identified by MRN, date of birth, ID band Patient awake    Reviewed: Allergy & Precautions, H&P , NPO status , Patient's Chart, lab work & pertinent test resultsPreop documentation limited or incomplete due to emergent nature of procedure.  Airway Mallampati: Intubated       Dental   Pulmonary neg pulmonary ROS,    breath sounds clear to auscultation       Cardiovascular negative cardio ROS   Rhythm:regular Rate:Normal     Neuro/Psych    GI/Hepatic GERD  ,  Endo/Other    Renal/GU      Musculoskeletal   Abdominal   Peds  Hematology   Anesthesia Other Findings   Reproductive/Obstetrics Breast CA                             Anesthesia Physical  Anesthesia Plan  ASA: III and emergent  Anesthesia Plan: General   Post-op Pain Management:    Induction:   Airway Management Planned: Oral ETT  Additional Equipment: Arterial line and CVP  Intra-op Plan:   Post-operative Plan: Possible Post-op intubation/ventilation  Informed Consent: I have reviewed the patients History and Physical, chart, labs and discussed the procedure including the risks, benefits and alternatives for the proposed anesthesia with the patient or authorized representative who has indicated his/her understanding and acceptance.   Dental advisory given  Plan Discussed with: CRNA  Anesthesia Plan Comments:         Anesthesia Quick Evaluation

## 2016-03-21 NOTE — Interval H&P Note (Signed)
History and Physical Interval Note: no change in H and P  03/21/2016 11:07 AM  Jacqueline Avery  has presented today for surgery, with the diagnosis of BREAST CANCER  The various methods of treatment have been discussed with the patient and family. After consideration of risks, benefits and other options for treatment, the patient has consented to  Procedure(s): INSERTION PORT-A-CATH (N/A) as a surgical intervention .  The patient's history has been reviewed, patient examined, no change in status, stable for surgery.  I have reviewed the patient's chart and labs.  Questions were answered to the patient's satisfaction.     Dhruvi Crenshaw A

## 2016-03-21 NOTE — Anesthesia Postprocedure Evaluation (Addendum)
Anesthesia Post Note  Patient: Jacqueline Avery  Procedure(s) Performed: Procedure(s) (LRB): INSERTION PORT-A-CATH (Right) CHEST TUBE INSERTION (Right) REMOVAL PORT-A-CATH (Right) VENOGRAM (Right)  Patient location during evaluation: Other (Transferred to [facility] from [place]) Anesthesia Type: General Level of consciousness: sedated and patient remains intubated per anesthesia plan Vital Signs Assessment: post-procedure vital signs reviewed and stable Respiratory status: patient remains intubated per anesthesia plan and patient on ventilator - see flowsheet for VS Cardiovascular status: Pt had intraoperative blood loss and was given 4 units PRBC. Anesthetic complications: no Comments: Intraoperatively, pt developed tamponade physiology from a right side hemothorax.  Right side chest tube was inserted by the surgeon, which improved hemodynamics.  Pt remained hypovolemic from the blood loss and was given 4 units PRBCs emergently.  BP stabilized and the bleeding seemed to have reduced or stopped.  She was transferred to the ICU intubated and sedated for further care.       Last Vitals:  Vitals:   03/21/16 1035 03/21/16 1500  BP: 125/72 137/90  Pulse: (!) 59 (!) 56  Resp: 20 18  Temp: 36.6 C     Last Pain:  Vitals:   03/21/16 1035  TempSrc: Oral  PainSc: 0-No pain                 Kamilla Hands S

## 2016-03-21 NOTE — Progress Notes (Signed)
      [address]       [place] [address]             [phone]      Now in SICU  Intubated and sedated  BP 137/90   Pulse (!) 56   Temp 97.8 F (36.6 C) (Oral)   Resp 18   Ht 5\' 6"  (1.676 m)   Wt 146 lb 6.4 oz (66.4 kg)   LMP 03/05/2016 (Exact Date)   SpO2 100%   BMI 23.63 kg/m    Intake/Output Summary (Last 24 hours) at 03/21/16 1604 Last data filed at 03/21/16 1427  Gross per 24 hour  Intake             4500 ml  Output             2500 ml  Net             2000 ml   About 1.5 L from right chest total initially. Little drainage from CT since portacath removed. BP stable since arrival, remains bradycardic  Needs continued close observation  Remo Lipps C. Roxan Hockey, MD [facility] [phone]

## 2016-03-21 NOTE — Addendum Note (Signed)
Encounter addended by: Doreen Beam, RN on: 03/21/2016  5:27 PM<BR>    Actions taken: Charge Capture section accepted

## 2016-03-21 NOTE — Op Note (Signed)
INSERTION PORT-Avery-CATH, CHEST TUBE INSERTION, REMOVAL PORT-Avery-CATH, VENOGRAM  Procedure Note  Jacqueline Avery 03/21/2016   Pre-op Diagnosis: BREAST CANCER     Post-op Diagnosis: same  Procedure(s): INSERTION PORT-Avery-CATH CHEST TUBE INSERTION (36 french) REMOVAL PORT-Avery-CATH VENOGRAM  Surgeon(s): Coralie Keens, MD Dr. Modesto Charon  Anesthesia: General  Staff:  Circulator: Lisbeth Ply, RN Radiology Technologist: Christoper Allegra, RT Scrub Person: Rhea Pink Neiers, CST  Estimated Blood Loss: 1500 cc               Pt experienced suspected venous injury. Stat thoracic surgery consult intra op Patient transferred to ICU          Jacqueline Avery   Date: 03/21/2016  Time: 1:28 PM

## 2016-03-22 ENCOUNTER — Telehealth: Payer: Self-pay | Admitting: *Deleted

## 2016-03-22 ENCOUNTER — Inpatient Hospital Stay (HOSPITAL_COMMUNITY): Payer: BLUE CROSS/BLUE SHIELD

## 2016-03-22 DIAGNOSIS — J9600 Acute respiratory failure, unspecified whether with hypoxia or hypercapnia: Secondary | ICD-10-CM

## 2016-03-22 LAB — POCT I-STAT, CHEM 8
BUN: 4 mg/dL — ABNORMAL LOW (ref 6–20)
CALCIUM ION: 1.03 mmol/L — AB (ref 1.15–1.40)
CHLORIDE: 106 mmol/L (ref 101–111)
Creatinine, Ser: 0.4 mg/dL — ABNORMAL LOW (ref 0.44–1.00)
Glucose, Bld: 173 mg/dL — ABNORMAL HIGH (ref 65–99)
HEMATOCRIT: 22 % — AB (ref 36.0–46.0)
Hemoglobin: 7.5 g/dL — ABNORMAL LOW (ref 12.0–15.0)
Potassium: 3.7 mmol/L (ref 3.5–5.1)
SODIUM: 142 mmol/L (ref 135–145)
TCO2: 21 mmol/L (ref 0–100)

## 2016-03-22 LAB — GLUCOSE, CAPILLARY
GLUCOSE-CAPILLARY: 123 mg/dL — AB (ref 65–99)
GLUCOSE-CAPILLARY: 126 mg/dL — AB (ref 65–99)
GLUCOSE-CAPILLARY: 143 mg/dL — AB (ref 65–99)
GLUCOSE-CAPILLARY: 131 mg/dL — AB (ref 65–99)
Glucose-Capillary: 141 mg/dL — ABNORMAL HIGH (ref 65–99)
Glucose-Capillary: 155 mg/dL — ABNORMAL HIGH (ref 65–99)

## 2016-03-22 LAB — CBC
HCT: 33.5 % — ABNORMAL LOW (ref 36.0–46.0)
HCT: 32 % — ABNORMAL LOW (ref 36.0–46.0)
Hemoglobin: 11.3 g/dL — ABNORMAL LOW (ref 12.0–15.0)
Hemoglobin: 10.7 g/dL — ABNORMAL LOW (ref 12.0–15.0)
MCH: 28.5 pg (ref 26.0–34.0)
MCH: 28.3 pg (ref 26.0–34.0)
MCHC: 33.7 g/dL (ref 30.0–36.0)
MCHC: 33.4 g/dL (ref 30.0–36.0)
MCV: 84.4 fL (ref 78.0–100.0)
MCV: 84.7 fL (ref 78.0–100.0)
PLATELETS: 96 10*3/uL — AB (ref 150–400)
PLATELETS: 101 10*3/uL — AB (ref 150–400)
RBC: 3.97 MIL/uL (ref 3.87–5.11)
RBC: 3.78 MIL/uL — ABNORMAL LOW (ref 3.87–5.11)
RDW: 16.2 % — AB (ref 11.5–15.5)
RDW: 16.1 % — AB (ref 11.5–15.5)
WBC: 13.1 10*3/uL — AB (ref 4.0–10.5)
WBC: 12.7 10*3/uL — AB (ref 4.0–10.5)

## 2016-03-22 LAB — TYPE AND SCREEN
BLOOD PRODUCT EXPIRATION DATE: 201803012359
Blood Product Expiration Date: 201803012359
Blood Product Expiration Date: 201803022359
Blood Product Expiration Date: 201803022359
ISSUE DATE / TIME: 201802081214
ISSUE DATE / TIME: 201802081214
ISSUE DATE / TIME: 201802081214
ISSUE DATE / TIME: 201802081214
UNIT TYPE AND RH: 9500
UNIT TYPE AND RH: 9500
Unit Type and Rh: 9500
Unit Type and Rh: 9500

## 2016-03-22 LAB — MAGNESIUM: MAGNESIUM: 1.7 mg/dL (ref 1.7–2.4)

## 2016-03-22 LAB — BASIC METABOLIC PANEL
ANION GAP: 9 (ref 5–15)
BUN: 7 mg/dL (ref 6–20)
CHLORIDE: 108 mmol/L (ref 101–111)
CO2: 25 mmol/L (ref 22–32)
Calcium: 8.4 mg/dL — ABNORMAL LOW (ref 8.9–10.3)
Creatinine, Ser: 0.58 mg/dL (ref 0.44–1.00)
GFR calc non Af Amer: >60 mL/min (ref >60)
Glucose, Bld: 126 mg/dL — ABNORMAL HIGH (ref 65–99)
Potassium: 3.7 mmol/L (ref 3.5–5.1)
Sodium: 142 mmol/L (ref 135–145)

## 2016-03-22 LAB — PROTIME-INR
INR: 1.26
PROTHROMBIN TIME: 15.9 s — AB (ref 11.4–15.2)

## 2016-03-22 LAB — APTT: APTT: 31 s (ref 24–36)

## 2016-03-22 LAB — PHOSPHORUS: PHOSPHORUS: 3.9 mg/dL (ref 2.5–4.6)

## 2016-03-22 MED ORDER — ONDANSETRON HCL 4 MG/2ML IJ SOLN
4.0000 mg | Freq: Three times a day (TID) | INTRAMUSCULAR | Status: DC | PRN
  Administered 2016-03-22 – 2016-03-23 (×3): 4 mg via INTRAVENOUS
  Filled 2016-03-22 (×2): qty 2

## 2016-03-22 MED ORDER — OXYCODONE HCL 5 MG PO TABS
5.0000 mg | ORAL_TABLET | ORAL | Status: DC | PRN
  Filled 2016-03-22 (×2): qty 1

## 2016-03-22 MED ORDER — MORPHINE SULFATE (PF) 2 MG/ML IV SOLN
1.0000 mg | INTRAVENOUS | Status: DC | PRN
  Administered 2016-03-22 – 2016-03-24 (×7): 2 mg via INTRAVENOUS
  Filled 2016-03-22 (×7): qty 1

## 2016-03-22 NOTE — Telephone Encounter (Signed)
On 03-22-16 fax medical records to [facility] , it was the consult note.

## 2016-03-22 NOTE — Progress Notes (Signed)
1 Day Post-Op  Subjective: Awake and alert, following commands on the vent Not much chest tube out put over night  Objective: Vital signs in last 24 hours: Temp:  [97.1 F (36.2 C)-99.6 F (37.6 C)] 99.6 F (37.6 C) (02/09 0400) Pulse Rate:  [47-87] 87 (02/09 0545) Resp:  [0-20] 2 (02/09 0545) BP: (87-137)/(51-99) 119/62 (02/09 0545) SpO2:  [99 %-100 %] 100 % (02/09 0545) FiO2 (%):  [40 %-100 %] 40 % (02/09 0400) Weight:  [66.4 kg (146 lb 6.4 oz)-72.5 kg (159 lb 13.3 oz)] 72.5 kg (159 lb 13.3 oz) (02/09 0430) Last BM Date:  (PTA)  Intake/Output from previous day: 02/08 0701 - 02/09 0700 In: [id number] [I.V.:5283.2; NG/GT:60; IV Piggyback:650] Out: [id number] [Urine:4000; Emesis/NG output:50; Blood:750; Chest Tube:2380] Intake/Output this shift: Total I/O In: 1275.6 [I.V.:1065.6; NG/GT:60; IV Piggyback:150] Out: 810 [Urine:600; Emesis/NG output:50; Chest Tube:160]  Exam: Awake, follows commands Lungs clear CV RRR CT without leak  Lab Results:   Recent Labs  03/21/16 2330 03/22/16 0413  WBC 14.0* 13.1*  HGB 11.6* 11.3*  HCT 34.7* 33.5*  PLT 92* 96*   BMET  Recent Labs  03/21/16 2330 03/22/16 0413  NA 141 142  K 3.7 3.7  CL 110 108  CO2 25 25  GLUCOSE 165* 126*  BUN 7 7  CREATININE 0.57 0.58  CALCIUM 8.2* 8.4*   PT/INR  Recent Labs  03/21/16 1654 03/22/16 0413  LABPROT 17.6* 15.9*  INR 1.43 1.26   ABG  Recent Labs  03/21/16 1540  PHART 7.466*  HCO3 23.5    Studies/Results: Dg Abd 1 View  Result Date: 03/21/2016 CLINICAL DATA:  Gastric tube placement EXAM: ABDOMEN - 1 VIEW COMPARISON:  Same day CXR FINDINGS: The tip and side port of a gastric tube extends below the left hemidiaphragm into the expected location of a J-shaped stomach, likely in the distal body or antrum. Moderate proximal gaseous distention of the stomach. No free air. Right-sided chest tube is seen at the base of the right hemithorax. There is borderline cardiomegaly. Small amount of  right pleural fluid. No pneumothorax. IMPRESSION: Gastric tube in the expected location of the stomach. Right-sided chest tube at the base of the right lung. Electronically Signed   By: Ashley Royalty M.D.   On: 03/21/2016 22:07   Dg Chest 1vsame Day  Result Date: 03/21/2016 CLINICAL DATA:  Venogram after Port-A-Cath placement. Patient developed right hemothorax during port placement. EXAM: INTRAOPERATIVE CHEST VENOGRAM WITH FLUOROSCOPY COMPARISON:  Chest CT 03/05/2016 and chest radiograph 03/21/2016 FLUOROSCOPY TIME:  Fluoroscopy Time:  20 seconds FINDINGS: The port catheter was pulled back compared to the earlier chest radiograph. Contrast injection demonstrates filling of the right innominate vein and SVC. However, there is contrast extravasation into the right chest cavity. There is a small tract medial to the right innominate vein which appears to represent the previous catheter tract. On the final images, the catheter was pulled out of the venous system and there is extravasation in the right chest soft tissues and right chest cavity. Again noted is a right chest tube with right pleural fluid. IMPRESSION: Contrast extravasation from the right innominate vein. It appears that the port catheter was extending through the right innominate vein into the extravascular soft tissues. Port-A-Cath was completely removed. Electronically Signed   By: Markus Daft M.D.   On: 03/21/2016 16:21   Dg Chest Port 1 View  Result Date: 03/21/2016 CLINICAL DATA:  RIGHT pneumothorax EXAM: PORTABLE CHEST 1 VIEW COMPARISON:  Radiograph 03/21/2016  FINDINGS: Removal of RIGHT port. Tracheal tube unchanged in position. RIGHT chest tube in place of the mid lung. Interval decrease in pleural fluid in the RIGHT hemithorax. NO pneumothorax identified. LEFT lung is clear. IMPRESSION: 1. Reduction in pleural fluid in the RIGHT hemithorax with chest tube in place. 2. Endotracheal tube in good position. 3. LEFT lung clear. Electronically Signed    By: Suzy Bouchard M.D.   On: 03/21/2016 17:26   Dg Chest Port 1 View  Result Date: 03/21/2016 CLINICAL DATA:  Chest tube in place EXAM: PORTABLE CHEST 1 VIEW COMPARISON:  03/05/2016 chest CT FINDINGS: There is a subclavian porta catheter on the right. Tip position is right of the trachea, but location is uncertain given the constellation of findings. There is a large pleural/subpleural opacity at the right apex. Mediastinum is shifted to the left. A chest tube is present, tip overlapping the mid medial right chest. There may be small pneumothorax in the right costophrenic sulcus. Endotracheal tube with tip at the clavicular heads. Normal heart size. Critical Value/emergent results were called by telephone at the time of interpretation on 03/21/2016 at 12:46 pm to Dr. Coralie Keens , who is aware of the findings and currently resuscitating the patient. Presumed large vessel bleeding. IMPRESSION: 1. Large right apical hemothorax/extrapleural hematoma with mediastinal shift. 2. Right-sided chest tube overlaps the mid chest. 3. Endotracheal tube in good position. 4. Right-sided porta catheter. Electronically Signed   By: Monte Fantasia M.D.   On: 03/21/2016 12:53   Dg Fluoro Guide Cv Line Right  Result Date: 03/21/2016 CLINICAL DATA:  Venogram after Port-A-Cath placement. Patient developed right hemothorax during port placement. EXAM: INTRAOPERATIVE CHEST VENOGRAM WITH FLUOROSCOPY COMPARISON:  Chest CT 03/05/2016 and chest radiograph 03/21/2016 FLUOROSCOPY TIME:  Fluoroscopy Time:  20 seconds FINDINGS: The port catheter was pulled back compared to the earlier chest radiograph. Contrast injection demonstrates filling of the right innominate vein and SVC. However, there is contrast extravasation into the right chest cavity. There is a small tract medial to the right innominate vein which appears to represent the previous catheter tract. On the final images, the catheter was pulled out of the venous system and  there is extravasation in the right chest soft tissues and right chest cavity. Again noted is a right chest tube with right pleural fluid. IMPRESSION: Contrast extravasation from the right innominate vein. It appears that the port catheter was extending through the right innominate vein into the extravascular soft tissues. Port-A-Cath was completely removed. Electronically Signed   By: Markus Daft M.D.   On: 03/21/2016 16:21   Dg Fluoro Guide Cv Line-no Report  Result Date: 03/21/2016 Fluoroscopy was utilized by the requesting physician.  No radiographic interpretation.    Anti-infectives: Anti-infectives    Start     Dose/Rate Route Frequency Ordered Stop   03/21/16 2000  ceFAZolin (ANCEF) IVPB 2g/100 mL premix     2 g 200 mL/hr over 30 Minutes Intravenous Every 8 hours 03/21/16 1735 03/22/16 2159   03/21/16 1014  ceFAZolin (ANCEF) IVPB 2g/100 mL premix     2 g 200 mL/hr over 30 Minutes Intravenous On call to O.R. 03/21/16 1014 03/21/16 1126      Assessment/Plan: s/p Procedure(s): INSERTION PORT-A-CATH (Right) CHEST TUBE INSERTION (Right) REMOVAL PORT-A-CATH (Right) VENOGRAM (Right)  Venous injury during port a cath insertion with need for chest tube insertion for hemothorax, transfusion  Hemodynamically stable Does not appear to have any further bleeding Hgb stable Hopefully extubate today per CCM  LOS: 1 day    Karolee Meloni A 03/22/2016

## 2016-03-22 NOTE — Progress Notes (Signed)
      [address]       [place] [address]             [phone]      Extubated earlier today.  C/o pain at CT site  BP (!) 98/54   Pulse 71   Temp 98.3 F (36.8 C) (Oral)   Resp 18   Ht 5\' 6"  (1.676 m)   Wt 159 lb 13.3 oz (72.5 kg)   LMP 03/05/2016 (Exact Date)   SpO2 100%   BMI 25.80 kg/m    Intake/Output Summary (Last 24 hours) at 03/22/16 1822 Last data filed at 03/22/16 0800  Gross per 24 hour  Intake          1954.43 ml  Output             2050 ml  Net           -95.57 ml   Minimal output from CT.  She had a lot of questions. Explained that bleeding is a potential complication of port-a-cath placement and that major bleeding is rare but does occur at times. Should not have any long term adverse effects  Remo Lipps C. Roxan Hockey, MD [facility] [phone]

## 2016-03-22 NOTE — Plan of Care (Signed)
Problem: Education: Goal: Knowledge of [facility] Education information/materials will improve Outcome: Progressing Patient extubated this am and was educated on her condition  Problem: Pain Managment: Goal: General experience of comfort will improve Outcome: Progressing Patient denies pain at this time

## 2016-03-22 NOTE — Progress Notes (Signed)
      [address]       [place] [address]             [phone]      Alert and writing questions  Still intubated  BP (!) 103/56   Pulse 67   Temp 99.6 F (37.6 C) (Oral)   Resp (!) 0   Ht 5\' 6"  (1.676 m)   Wt 159 lb 13.3 oz (72.5 kg)   LMP 03/05/2016 (Exact Date)   SpO2 100%   BMI 25.80 kg/m    Intake/Output Summary (Last 24 hours) at 03/22/16 0734 Last data filed at 03/22/16 0400  Gross per 24 hour  Intake          5993.24 ml  Output             7180 ml  Net         -1186.76 ml   Minimal CT output overnight  Lungs clear anteriorly  CXR shows some residual effusion/ hemothorax at right base  Agree with plan to extubate Would leave CT in place today- some additional fluid may drain when she starts moving around  [place]. Roxan Hockey, MD [facility] [phone]

## 2016-03-22 NOTE — Op Note (Signed)
Jacqueline Avery, BAISCH                    ACCOUNT NO.:  [id number]  MEDICAL RECORD NO.:  [id number]  LOCATION:                                 FACILITY:  PHYSICIAN:  Coralie Keens, M.D. DATE OF BIRTH:  14-Aug-1965  DATE OF PROCEDURE:  03/21/2016 DATE OF DISCHARGE:                              OPERATIVE REPORT   PREOPERATIVE DIAGNOSIS:  Left breast cancer.  POSTOPERATIVE DIAGNOSIS:  Left breast cancer.  PROCEDURE: 1. Right subclavian Port-A-Cath insertion. 2. Right chest tube insertion. 3. Removal of Port-A-Cath. 4. Intraoperative venogram.  SURGEON:  Coralie Keens, M.D.  CONSULTANT:  Revonda Standard. Roxan Hockey, M.D.  ANESTHESIA:  General endotracheal anesthesia.  ESTIMATED BLOOD LOSS:  1500 mL.  INDICATIONS:  This is a 51 year old female, who was found to have metastatic breast cancer from the left breast to her left axillary lymph nodes.  After discussion in the multidisciplinary breast cancer conference, decision has been made to start neoadjuvant therapy. Therefore, Port-A-Cath insertion has been requested.  I discussed this with the patient preoperatively.  We discussed the risks, which included, but were not limited to bleeding, infection, pneumothorax, etc.  She agreed to proceed.  FINDINGS:  After the Port-A-Cath was placed and it was accessed and flushed, the patient suddenly became hypotensive.  It was felt initially that she had a pneumothorax, so I emergently placed a chest tube and immediately evacuated blood from the chest.  Thoracic Surgery was consulted urgently as the patient remained hypotensive.  She was resuscitated.  Thoracic Surgery presented to the room.  We again accessed the port, but could not get any return.  We went ahead and started backing it out after opening the port site and performing a venogram.  We did not see any gross extravasation of contrast.  The Port- A-Cath was thus completely removed.  She remained intubated and decision was then  made to transfer her to the intensive care unit.  PROCEDURE IN DETAIL:  The patient was brought to the operating room, identified as Jacqueline Avery.  She was placed supine on the operating room table and anesthesia was induced.  We were originally going to do this under a MAC anesthesia, but decision was then made to change the general anesthesia.  This was then performed.  The patient was then placed in the Trendelenburg position.  Her right chest and neck were then prepped and draped in usual sterile fashion.  I used an introducer needle.  I easily cannulated the right subclavian vein.  I passed a wire over the needle, but it would not completely go easily, so I pulled it out without forcing it.  I then removed my needle.  I then repositioned the needle and again was able to easily cannulate the left subclavian vein. I then passed the wire easily through the central venous system.  Under fluoroscopy, it appeared across the innominate and go up into the left jugular vein.  I then under direct fluoroscopy, backed the wire back and then easily went down into the central venous system.  I then anesthetized the skin further with Marcaine.  I made an incision with a scalpel.  I then created a  pocket for the port and needle introduction site.  An 8-French port was then brought onto the field.  I attached the catheter to the port and cut in appropriate length.  I then passed the venous dilator introducer sheath easily over the guidewire.  The guidewire and dilator were removed.  I then put the port into the pocket and fed easily down the subclavian vein.  The sheath was then peeled away.  I then accessed the port and good flush and return were demonstrated.  Fluoroscopy was performed and the port appeared to be in good location.  At this point, I secured it to the chest wall with 2 separate 3-0 Prolene sutures.  I then closed subcutaneous tissue with interrupted 3-0 Vicryl sutures and closed the skin  with a running 4-0 Monocryl.  A bandage was then applied.  At this point, the patient suddenly became hypotensive.  Anesthesia listened and there did not appear to be adequate breath sounds on the right side.  I assume there was pneumothorax.  Chest tube was brought into the field.  I made an incision in the right chest and tunneled down to the ribs.  I then opened up the thoracic cavity with a hemostat. There was immediate rush of blood and not air.  I placed a 36-French chest tube into the right chest.  I then secured this in place with silk sutures.  Immediately approximately 6-800 mL of blood came out of the chest.  At this point, I decided to emergently call for an intraoperative consult from Thoracic Surgery.  We continued to resuscitate the patient, more IVs were placed.  Anesthesia attempted an internal jugular central venous access, but were unsuccessful.  I was able to put a central line in the patient's right femoral vein.  At this point, emergency release blood was given.  Dr. Modesto Charon then presented to the room.  At this point, his chest tube an output out approximately 1500 mL of blood.  After further discussion, we decided to access the port.  We were unable to get any return from the port at this point.  We then opened up the incision and took the sutures out from the port when we started backing it out performing a venogram.  Venogram was performed as the port was slowly backed out, we saw no extravasation of contrast at any point.  All the contrast appeared to go easily into the heart.  The port was then completely removed.  At this point, the patient appeared hemodynamically stable.  She received 3 units of packed red blood cells.  I closed the port site with 3-0 Vicryl sutures and 4-0 Monocryl and placed a Tegaderm.  Decision was then made to forego transfer to the operating room at the main hospital and take her instead to the intensive care unit.  At this  point, I broke from the procedure to talk to the family.  She again will remain intubated and will be taken to the intensive care unit at the main hospital.     Coralie Keens, M.D.   ______________________________ Coralie Keens, M.D.    DB/MEDQ  D:  03/21/2016  T:  03/22/2016  Job:  [id number]

## 2016-03-22 NOTE — Progress Notes (Signed)
Patient ID: Jacqueline Avery, female   DOB: 1965-09-12, 51 y.o.   MRN: [id number]   The patient is now awake and alert and remains hemodynamically stable. She has been extubated. She has pain at the chest tube site. She denies shortness of breath.  At this point, I will be transferring her out of the intensive care area.  This morning, I had a 45 minute conversation with the patient's husband regarding the events of yesterday. I had another hour-long conversation with the patient and his wife at the bedside. I expanded the details of the events. Again, I am uncertain of where in the subclavian vein that the bleeding had occurred. I'm uncertain whether this is a tangential injury or whether she was bleeding around the site where the catheter went into the vein. Again, venogram in the operating room showed no further bleeding from the vessel and an abnormality cannot be identified. I explained to them the technique of placing a Port-A-Cath in detail as well. Again, I am uncertain of how this occurred and not seen this happen from my experience. I have contacted oncology about what happened.  They again wanted to know exactly how this had happened but I could only theorize. I have asked several of my partners and the thoracic surgeon as well he did not have an exact answer. The family has a lot of questions for oncology as to the timing of the next port. They're uncertain whether she will support now and whether this will delay her therapy. I believe she has a port placed, this should be considered to be done by interventional radiology today could perform a more dedicated venogram to see if this will better define her anatomy and determine what may have happened.

## 2016-03-22 NOTE — Op Note (Signed)
NAMEMAISHA, KAPPUS                    ACCOUNT NO.:  [id number]  MEDICAL RECORD NO.:  [id number]  LOCATION:                                 FACILITY:  PHYSICIAN:  Revonda Standard. Roxan Hockey, M.D. DATE OF BIRTH:  DATE OF PROCEDURE:  03/21/2016 DATE OF DISCHARGE:                              OPERATIVE REPORT   PREOPERATIVE DIAGNOSIS:  Bleeding during Port-A-Cath insertion.  POSTOPERATIVE DIAGNOSIS:  Bleeding during Port-A-Cath insertion.  PROCEDURE:  Right subclavian venogram.  SURGEON:  Revonda Standard. Roxan Hockey, MD.  ANESTHESIA:  General.  FINDINGS:  No extravasation of contrast.  CLINICAL NOTE:  Mrs. Macaria is a 51 year old woman who was having a Port-A- Cath placed by Dr. Coralie Keens for IV access for chemotherapy for the treatment of breast cancer.  During the procedure, the patient experienced hypotension.  A right chest tube was placed and a large amount of dark venous blood was evacuated from the right chest.  I was called to consult intraoperatively at the Day Surgery Unit.  DESCRIPTION OF PROCEDURE:  Mrs. Glascoe was already anesthetized and intubated and in the operating room at the [facility] Day Surgery Unit.  Dr. Ninfa Linden was present and resuscitation was underway.  When I arrived, the patient's blood pressure had stabilized.  She had drained almost 1.5 L of blood from the right chest tube since the time of placement.  In consultation with Dr. Ninfa Linden, we agreed that the Port-A-Cath needed to be removed.  The incision was reopened.  The fixation sutures were divided.  The port was accessed with a Huber needle.  There was no blood return with aspiration.  The port then was gently retracted until there was good blood return with aspiration.  Omnipaque was diluted 1:1 with normal saline.  A venogram was performed using fluoroscopy.  The contrast went quickly into the superior vena cava and then right atrium. There was no evidence of a leak.  The catheter then was pulled back further  and the process repeated and again, there was no leakage from the catheter.  On the final injection, it was determined that the catheter had been removed completely from the vein.  As the patient had stabilized hemodynamically and there was no evidence of ongoing bleeding, the decision was made to observe the patient.  She remained stable and will be transported to the surgical intensive care unit at [facility] for further management.  Dr. Ninfa Linden was present the entire time during the procedure and he performed the wound closure.     Revonda Standard Roxan Hockey, M.D.     SCH/MEDQ  D:  03/21/2016  T:  03/22/2016  Job:  [id number]

## 2016-03-22 NOTE — Procedures (Signed)
Extubation Procedure Note  Patient Details:   Name: Jacqueline Avery DOB: [birth year] MRN: [id number]   Airway Documentation:     Evaluation  O2 sats: stable throughout Complications: No apparent complications Patient did tolerate procedure well. Bilateral Breath Sounds: Clear, Diminished   Yes  Patient tolerated wean. Positive for cuff leak. Patient extubated to a 2 Lpm nasal cannula. No signs of dyspnea or stridor. Patient instructed on the Incentive Spirometer achieving 863mL three times. Resting comfortably. RN at bedside.   Myrtie Neither 03/22/2016, 9:20 AM

## 2016-03-22 NOTE — Progress Notes (Signed)
Wasted 205 ml of fentanyl into sink with Kathleen Argue RN as witness

## 2016-03-22 NOTE — Progress Notes (Signed)
PULMONARY / CRITICAL CARE MEDICINE   Name: Jacqueline Avery MRN: [id number] DOB: 04/23/1965    ADMISSION DATE:  03/21/2016 CONSULTATION DATE:  03/21/16  REFERRING MD:  Dr. Ninfa Linden  CHIEF COMPLAINT:  Hemothorax  BRIEF SUMMARY:   51 y/o F with recent diagnosis of breast cancer admitted 2/8 for port-a-cath placement.  Course complicated by postoperative hypotension and right sided pneumothorax requiring emergent placement of chest tube.  1500 ml bloody fluid drained.  The port-a-cath was backed out with venograms to assess placement and eventually removed. She had an H/H of 6.1 and 18, and was given 4 units of blood. She required neo and fem line placement in OR. She remained intubated post-operatively.    SUBJECTIVE: RT reports pt weaning on PSV, awake / alert and calm.  Intermittent agitation overnight.  Approximately ~17ml bloody drainage from chest tube site.    VITAL SIGNS: BP 137/90   Pulse (!) 56   Temp 97.8 F (36.6 C) (Oral)   Resp 18   Ht 5\' 6"  (1.676 m)   Wt 146 lb 6.4 oz (66.4 kg)   LMP 03/05/2016 (Exact Date)   SpO2 100%   BMI 23.63 kg/m   HEMODYNAMICS:    VENTILATOR SETTINGS:    INTAKE / OUTPUT: No intake/output data recorded.  PHYSICAL EXAMINATION: General:  Adult female in NAD  HEENT: MM pink/moist, ETT Neuro: awake, alert, follows commands, MAE CV: s1s2 rrr, no m/r/g PULM: even/non-labored, lungs bilaterally clear, diminished R lower [id number], non-tender, bsx4 active  Extremities: warm/dry, no edema  Skin: no rashes or lesions   LABS:  BMET  Recent Labs Lab 03/21/16 1250  NA 137  K 3.6  CL 101  BUN 6  CREATININE 0.50  GLUCOSE 328*    Electrolytes No results for input(s): CALCIUM, MG, PHOS in the last 168 hours.  CBC  Recent Labs Lab 03/21/16 1250  HGB 6.1*  HCT 18.0*    Coag's No results for input(s): APTT, INR in the last 168 hours.  Sepsis Markers No results for input(s): LATICACIDVEN, PROCALCITON, O2SATVEN in the last 168  hours.  ABG No results for input(s): PHART, PCO2ART, PO2ART in the last 168 hours.  Liver Enzymes No results for input(s): AST, ALT, ALKPHOS, BILITOT, ALBUMIN in the last 168 hours.  Cardiac Enzymes No results for input(s): TROPONINI, PROBNP in the last 168 hours.  Glucose No results for input(s): GLUCAP in the last 168 hours.  Imaging Dg Chest Port 1 View  Result Date: 03/21/2016 CLINICAL DATA:  Chest tube in place EXAM: PORTABLE CHEST 1 VIEW COMPARISON:  03/05/2016 chest CT FINDINGS: There is a subclavian porta catheter on the right. Tip position is right of the trachea, but location is uncertain given the constellation of findings. There is a large pleural/subpleural opacity at the right apex. Mediastinum is shifted to the left. A chest tube is present, tip overlapping the mid medial right chest. There may be small pneumothorax in the right costophrenic sulcus. Endotracheal tube with tip at the clavicular heads. Normal heart size. Critical Value/emergent results were called by telephone at the time of interpretation on 03/21/2016 at 12:46 pm to Dr. Coralie Keens , who is aware of the findings and currently resuscitating the patient. Presumed large vessel bleeding. IMPRESSION: 1. Large right apical hemothorax/extrapleural hematoma with mediastinal shift. 2. Right-sided chest tube overlaps the mid chest. 3. Endotracheal tube in good position. 4. Right-sided porta catheter. Electronically Signed   By: Monte Fantasia M.D.   On: 03/21/2016 12:53  Dg Fluoro Guide Cv Line-no Report  Result Date: 03/21/2016 Fluoroscopy was utilized by the requesting physician.  No radiographic interpretation.    STUDIES:  CXR 2/9 >> images personally reviewed, chest tube in good position, small layering right effusion   CULTURES:   ANTIBIOTICS:   SIGNIFICANT EVENTS: 2/08 port-a-cath placement then removed with complication hemothorax [id number] admit with hemothorax on vent, shock 2/09 extubated to [place] O2,  150 ml bloody drainage in last 24 hours from chest tube  LINES/TUBES: ETT 2/08 >> 2/9 Fem line (in OR ) 2/8 >>   DISCUSSION: 51 y/o F with hemothorax s/p port-a-cath placement. 1500 mL bloody fluid removed from chest.  ASSESSMENT / PLAN:  PULMONARY A: Rt hemothorax Acute resp failure At risk ALI rt lung P:   Wean to extubation, monitor in ICU overnight O2 as needed to maintain sats >92% Pulmonary hygiene - IS, mobilize  CVTS following, defer chest tube management to them  CARDIOVASCULAR A:  Bradycardia - secondary to precedex Hemorrhagic shock, likely have residual vasoplegia Pos pressure ventilation contribution to shock - resolved P:  Monitor in ICU post extubation  Wean neosynephrine to off for MAP >65 NS @ 50 ml/hr  RENAL A:   At Risk AKI - in setting of bleeding, shock P:   NS @ 50 ml/hr  Trend BMP / UOP  Replace electrolytes as indicated  GASTROINTESTINAL A:   At risk shock liver due to acute blood loss / hypotension P:   Trend LFT's  NPO x4 hours, then advance diet as tolerated  Pepcid  HEMATOLOGIC A:   Hemodynamic shock DVT prevention Hemothorax Risk consumptive and dilutional coagulapthy P:  Trend CBC, reduce frequency to QD Transfuse for Hgb <7 Monitor Hgb  SCD's for DVT prophylaxis   INFECTIOUS A:   Peri op risk P:   Ancef per CCS  ENDOCRINE A:   Hyperglycemia  May need to r/o rel AI P:   SSI   NEUROLOGIC A:   Sedated on ventilator - resolved.  P:   PRN fentanyl for pain    FAMILY  - Updates:  No family available am 2/9.  Patient updated on plan of care.   - Inter-disciplinary family meet or Palliative Care meeting due by:  2/15   CC Time: 21 minutes  [name], NP-C [facility] Pgr: [phone] or if no answer [phone] 03/22/2016, 9:31 AM  Attending Note:  I have examined patient, reviewed labs, studies and notes. I have discussed the case with B Ollis, and I agree with the data and plans as  amended above. 51 year old woman with a history of breast cancer who underwent Port-A-Cath placement on 2/8. The course was complicated by hypotension and right-sided hemopneumothorax. She underwent chest tube placement on the right. Also received 4 units packed red blood cells. The Port-A-Cath was ultimately removed. We will consulted as she was left on mechanical ventilation postoperatively. On my evaluation this morning she was awake and calm tolerating pressure support with good lung volumes. She has no wheezing, no significant crackles. She meets criteria for extubation and this will be performed this morning. Independent critical care time is 35 minutes.   Baltazar Apo, MD, PhD 03/22/2016, 2:48 PM [facility] [phone] or if no answer [phone]

## 2016-03-22 NOTE — Progress Notes (Signed)
eLink Physician-Brief Progress Note Patient Name: Jacqueline Avery DOB: 08-15-1965 MRN: [id number]   Date of Service  03/22/2016  HPI/Events of Note  Pt extubated today.  Camera check done.  Pt seen, comfortable, no subjective complaints.  NAD. Protecting her airway.   98/54, 72, 99% on [place]  eICU Interventions  Cont to observe.         [place] 03/22/2016, 4:45 PM

## 2016-03-23 ENCOUNTER — Inpatient Hospital Stay (HOSPITAL_COMMUNITY): Payer: BLUE CROSS/BLUE SHIELD

## 2016-03-23 DIAGNOSIS — R739 Hyperglycemia, unspecified: Secondary | ICD-10-CM

## 2016-03-23 DIAGNOSIS — J9601 Acute respiratory failure with hypoxia: Secondary | ICD-10-CM

## 2016-03-23 DIAGNOSIS — Z978 Presence of other specified devices: Secondary | ICD-10-CM

## 2016-03-23 LAB — CBC
HEMATOCRIT: 30.2 % — AB (ref 36.0–46.0)
HEMOGLOBIN: 9.6 g/dL — AB (ref 12.0–15.0)
MCH: 27.7 pg (ref 26.0–34.0)
MCHC: 31.8 g/dL (ref 30.0–36.0)
MCV: 87 fL (ref 78.0–100.0)
Platelets: 80 10*3/uL — ABNORMAL LOW (ref 150–400)
RBC: 3.47 MIL/uL — AB (ref 3.87–5.11)
RDW: 16.4 % — ABNORMAL HIGH (ref 11.5–15.5)
WBC: 9.5 10*3/uL (ref 4.0–10.5)

## 2016-03-23 LAB — BASIC METABOLIC PANEL
ANION GAP: 7 (ref 5–15)
BUN: 5 mg/dL — ABNORMAL LOW (ref 6–20)
CALCIUM: 8 mg/dL — AB (ref 8.9–10.3)
CO2: 25 mmol/L (ref 22–32)
Chloride: 107 mmol/L (ref 101–111)
Creatinine, Ser: 0.53 mg/dL (ref 0.44–1.00)
GLUCOSE: 116 mg/dL — AB (ref 65–99)
POTASSIUM: 3.7 mmol/L (ref 3.5–5.1)
Sodium: 139 mmol/L (ref 135–145)

## 2016-03-23 LAB — GLUCOSE, CAPILLARY
GLUCOSE-CAPILLARY: 112 mg/dL — AB (ref 65–99)
GLUCOSE-CAPILLARY: 129 mg/dL — AB (ref 65–99)
GLUCOSE-CAPILLARY: 184 mg/dL — AB (ref 65–99)
Glucose-Capillary: 105 mg/dL — ABNORMAL HIGH (ref 65–99)

## 2016-03-23 MED ORDER — ONDANSETRON HCL 4 MG/2ML IJ SOLN: 4.0000 mg | INTRAMUSCULAR | Status: DC | PRN

## 2016-03-23 MED ORDER — ONDANSETRON HCL 4 MG/2ML IJ SOLN
4.0000 mg | INTRAMUSCULAR | Status: DC | PRN
  Administered 2016-03-23 – 2016-03-24 (×3): 4 mg via INTRAVENOUS
  Filled 2016-03-23 (×3): qty 2

## 2016-03-23 NOTE — Progress Notes (Signed)
2 Days Post-Op  Subjective: No acute events overnight Some L sided chest pain  Objective: Vital signs in last 24 hours: Temp:  [98.3 F (36.8 C)-99.5 F (37.5 C)] 98.6 F (37 C) (02/09 2325) Pulse Rate:  [69-92] 87 (02/10 0700) Resp:  [10-22] 19 (02/10 0700) BP: (87-113)/(51-64) 103/64 (02/10 0700) SpO2:  [90 %-100 %] 93 % (02/10 0700) Weight:  [70 kg (154 lb 5.2 oz)] 70 kg (154 lb 5.2 oz) (02/10 0000) Last BM Date:  (PTA)  Intake/Output from previous day: 02/09 0701 - 02/10 0700 In: 1287.5 [P.O.:120; I.V.:1167.5] Out: [id number] [Urine:1700; Chest Tube:170] Intake/Output this shift: No intake/output data recorded.  General appearance: alert and cooperative Resp: clear to auscultation bilaterally Cardio: regular rate and rhythm, S1, S2 normal, no murmur, click, rub or gallop GI: soft, non-tender; bowel sounds normal; no masses,  no organomegaly  Lab Results:   Recent Labs  03/22/16 0834 03/23/16 0359  WBC 12.7* 9.5  HGB 10.7* 9.6*  HCT 32.0* 30.2*  PLT 101* 80*   BMET  Recent Labs  03/22/16 0413 03/23/16 0359  NA 142 139  K 3.7 3.7  CL 108 107  CO2 25 25  GLUCOSE 126* 116*  BUN 7 5*  CREATININE 0.58 0.53  CALCIUM 8.4* 8.0*   PT/INR  Recent Labs  03/21/16 1654 03/22/16 0413  LABPROT 17.6* 15.9*  INR 1.43 1.26   ABG  Recent Labs  03/21/16 1540  PHART 7.466*  HCO3 23.5    Studies/Results: Dg Abd 1 View  Result Date: 03/21/2016 CLINICAL DATA:  Gastric tube placement EXAM: ABDOMEN - 1 VIEW COMPARISON:  Same day CXR FINDINGS: The tip and side port of a gastric tube extends below the left hemidiaphragm into the expected location of a J-shaped stomach, likely in the distal body or antrum. Moderate proximal gaseous distention of the stomach. No free air. Right-sided chest tube is seen at the base of the right hemithorax. There is borderline cardiomegaly. Small amount of right pleural fluid. No pneumothorax. IMPRESSION: Gastric tube in the expected location  of the stomach. Right-sided chest tube at the base of the right lung. Electronically Signed   By: Ashley Royalty M.D.   On: 03/21/2016 22:07   Dg Chest 1vsame Day  Result Date: 03/21/2016 CLINICAL DATA:  Venogram after Port-A-Cath placement. Patient developed right hemothorax during port placement. EXAM: INTRAOPERATIVE CHEST VENOGRAM WITH FLUOROSCOPY COMPARISON:  Chest CT 03/05/2016 and chest radiograph 03/21/2016 FLUOROSCOPY TIME:  Fluoroscopy Time:  20 seconds FINDINGS: The port catheter was pulled back compared to the earlier chest radiograph. Contrast injection demonstrates filling of the right innominate vein and SVC. However, there is contrast extravasation into the right chest cavity. There is a small tract medial to the right innominate vein which appears to represent the previous catheter tract. On the final images, the catheter was pulled out of the venous system and there is extravasation in the right chest soft tissues and right chest cavity. Again noted is a right chest tube with right pleural fluid. IMPRESSION: Contrast extravasation from the right innominate vein. It appears that the port catheter was extending through the right innominate vein into the extravascular soft tissues. Port-A-Cath was completely removed. Electronically Signed   By: Markus Daft M.D.   On: 03/21/2016 16:21   Dg Chest Port 1 View  Result Date: 03/22/2016 CLINICAL DATA:  Hemothorax. EXAM: PORTABLE CHEST 1 VIEW COMPARISON:  March 21, 2016 FINDINGS: The ETT is in good position. An NG tube terminates below today's film.  No left-sided pneumothorax. A right chest tube remains in place with no visualize right pneumothorax. Layering effusion on the right is slightly improved. Mild atelectasis remains in the left base. The cardiomediastinal silhouette is unchanged. No other interval changes. IMPRESSION: Stable right chest tube with slight improvement of the layering right pleural effusion and no identified pneumothorax.  Electronically Signed   By: Dorise Bullion III M.D   On: 03/22/2016 08:45   Dg Chest Port 1 View  Result Date: 03/21/2016 CLINICAL DATA:  RIGHT pneumothorax EXAM: PORTABLE CHEST 1 VIEW COMPARISON:  Radiograph 03/21/2016 FINDINGS: Removal of RIGHT port. Tracheal tube unchanged in position. RIGHT chest tube in place of the mid lung. Interval decrease in pleural fluid in the RIGHT hemithorax. NO pneumothorax identified. LEFT lung is clear. IMPRESSION: 1. Reduction in pleural fluid in the RIGHT hemithorax with chest tube in place. 2. Endotracheal tube in good position. 3. LEFT lung clear. Electronically Signed   By: Suzy Bouchard M.D.   On: 03/21/2016 17:26   Dg Chest Port 1 View  Result Date: 03/21/2016 CLINICAL DATA:  Chest tube in place EXAM: PORTABLE CHEST 1 VIEW COMPARISON:  03/05/2016 chest CT FINDINGS: There is a subclavian porta catheter on the right. Tip position is right of the trachea, but location is uncertain given the constellation of findings. There is a large pleural/subpleural opacity at the right apex. Mediastinum is shifted to the left. A chest tube is present, tip overlapping the mid medial right chest. There may be small pneumothorax in the right costophrenic sulcus. Endotracheal tube with tip at the clavicular heads. Normal heart size. Critical Value/emergent results were called by telephone at the time of interpretation on 03/21/2016 at 12:46 pm to Dr. Coralie Keens , who is aware of the findings and currently resuscitating the patient. Presumed large vessel bleeding. IMPRESSION: 1. Large right apical hemothorax/extrapleural hematoma with mediastinal shift. 2. Right-sided chest tube overlaps the mid chest. 3. Endotracheal tube in good position. 4. Right-sided porta catheter. Electronically Signed   By: Monte Fantasia M.D.   On: 03/21/2016 12:53   Dg Fluoro Guide Cv Line Right  Result Date: 03/21/2016 CLINICAL DATA:  Venogram after Port-A-Cath placement. Patient developed right  hemothorax during port placement. EXAM: INTRAOPERATIVE CHEST VENOGRAM WITH FLUOROSCOPY COMPARISON:  Chest CT 03/05/2016 and chest radiograph 03/21/2016 FLUOROSCOPY TIME:  Fluoroscopy Time:  20 seconds FINDINGS: The port catheter was pulled back compared to the earlier chest radiograph. Contrast injection demonstrates filling of the right innominate vein and SVC. However, there is contrast extravasation into the right chest cavity. There is a small tract medial to the right innominate vein which appears to represent the previous catheter tract. On the final images, the catheter was pulled out of the venous system and there is extravasation in the right chest soft tissues and right chest cavity. Again noted is a right chest tube with right pleural fluid. IMPRESSION: Contrast extravasation from the right innominate vein. It appears that the port catheter was extending through the right innominate vein into the extravascular soft tissues. Port-A-Cath was completely removed. Electronically Signed   By: Markus Daft M.D.   On: 03/21/2016 16:21   Dg Fluoro Guide Cv Line-no Report  Result Date: 03/21/2016 Fluoroscopy was utilized by the requesting physician.  No radiographic interpretation.    Anti-infectives: Anti-infectives    Start     Dose/Rate Route Frequency Ordered Stop   03/21/16 2000  ceFAZolin (ANCEF) IVPB 2g/100 mL premix     2 g 200 mL/hr over  30 Minutes Intravenous Every 8 hours 03/21/16 1735 03/22/16 1531   03/21/16 1014  ceFAZolin (ANCEF) IVPB 2g/100 mL premix     2 g 200 mL/hr over 30 Minutes Intravenous On call to O.R. 03/21/16 1014 03/21/16 1126      Assessment/Plan: s/p Procedure(s): INSERTION PORT-A-CATH (Right) CHEST TUBE INSERTION (Right) REMOVAL PORT-A-CATH (Right) VENOGRAM (Right) Extubated  Con't Clears for now as pt con't to be nauseated. Mobilize and in chair as tol Appreciate Dr. Leonarda Salon assistance.  Con't with CT for now Likely to SDU when bed available.   LOS:  2 days    Rosario Jacks., [facility] 03/23/2016

## 2016-03-23 NOTE — Progress Notes (Signed)
      [address]       [place] [address]             [phone]      Some pain from tube and some nausea  BP 108/66 (BP Location: Left Arm)   Pulse 77   Temp 98.8 F (37.1 C) (Oral)   Resp 18   Ht 5\' 6"  (1.676 m)   Wt 154 lb 5.2 oz (70 kg)   LMP 03/05/2016 (Exact Date)   SpO2 94%   BMI 24.91 kg/m    Intake/Output Summary (Last 24 hours) at 03/23/16 0849 Last data filed at 03/23/16 0800  Gross per 24 hour  Intake             1245 ml  Output             2790 ml  Net            -1545 ml   Hgb stable  PORTABLE CHEST 1 VIEW  COMPARISON:  03/22/2016  FINDINGS: Interval extubation.  Interval removal of the nasogastric tube.  Right basilar chest tube with a small right pleural effusion. No right pneumothorax. Left lung is clear. No left pleural effusion or pneumothorax. Stable cardiomediastinal silhouette.  No acute osseous abnormality.  IMPRESSION: 1. Right basilar chest tube with a small right pleural effusion. No pneumothorax.   Electronically Signed   By: Kathreen Devoid   On: 03/23/2016 08:14 I personally reviewed the CXR and agree with above  IMPRESSION S/p right CT for hemothorax Tube is not draining much and only has a small effusion- I think the tube can probably come out but will discuss with Dr. Clayborn Heron C. Roxan Hockey, MD [facility] [phone]

## 2016-03-23 NOTE — Progress Notes (Signed)
PULMONARY / CRITICAL CARE MEDICINE   Name: Jacqueline Avery MRN: [id number] DOB: [birth year]    ADMISSION DATE:  03/21/2016 CONSULTATION DATE:  03/21/16  REFERRING MD:  Dr. Ninfa Linden  CHIEF COMPLAINT:  Hemothorax  BRIEF SUMMARY:   51 y/o F with recent diagnosis of breast cancer admitted 2/8 for port-a-cath placement.  Course complicated by postoperative hypotension and right sided pneumothorax requiring emergent placement of chest tube.  1500 ml bloody fluid drained.  The port-a-cath was backed out with venograms to assess placement and eventually removed. She had an H/H of 6.1 and 18, and was given 4 units of blood. She required neo and fem line placement in OR. She remained intubated post-operatively.   SUBJECTIVE: Extubated 2/9 and done well.  Chest tube remains in place.  VITAL SIGNS: BP 137/90   Pulse (!) 56   Temp 97.8 F (36.6 C) (Oral)   Resp 18   Ht 5\' 6"  (1.676 m)   Wt 146 lb 6.4 oz (66.4 kg)   LMP 03/05/2016 (Exact Date)   SpO2 100%   BMI 23.63 kg/m   HEMODYNAMICS:    VENTILATOR SETTINGS:    INTAKE / OUTPUT: No intake/output data recorded.  PHYSICAL EXAMINATION: General:  Awake and comfortable female resting comfortably in bedside chair, NAD  HEENT: [place]/AT, PERRL, EOM-I and MMM Neuro: Alert and oriented, moving all ext to command CV: RRR, Nl S1/S2, -M/R/G PULM: Decreased BS on the right GI: Soft, NT, ND and +BS Extremities: -edema and -tenderness Skin: no rashes or lesions  LABS:  BMET  Recent Labs Lab 03/21/16 1250  NA 137  K 3.6  CL 101  BUN 6  CREATININE 0.50  GLUCOSE 328*    Electrolytes No results for input(s): CALCIUM, MG, PHOS in the last 168 hours.  CBC  Recent Labs Lab 03/21/16 1250  HGB 6.1*  HCT 18.0*    Coag's No results for input(s): APTT, INR in the last 168 hours.  Sepsis Markers No results for input(s): LATICACIDVEN, PROCALCITON, O2SATVEN in the last 168 hours.  ABG No results for input(s): PHART, PCO2ART, PO2ART in  the last 168 hours.  Liver Enzymes No results for input(s): AST, ALT, ALKPHOS, BILITOT, ALBUMIN in the last 168 hours.  Cardiac Enzymes No results for input(s): TROPONINI, PROBNP in the last 168 hours.  Glucose No results for input(s): GLUCAP in the last 168 hours.  Imaging Dg Chest Port 1 View  Result Date: 03/21/2016 CLINICAL DATA:  Chest tube in place EXAM: PORTABLE CHEST 1 VIEW COMPARISON:  03/05/2016 chest CT FINDINGS: There is a subclavian porta catheter on the right. Tip position is right of the trachea, but location is uncertain given the constellation of findings. There is a large pleural/subpleural opacity at the right apex. Mediastinum is shifted to the left. A chest tube is present, tip overlapping the mid medial right chest. There may be small pneumothorax in the right costophrenic sulcus. Endotracheal tube with tip at the clavicular heads. Normal heart size. Critical Value/emergent results were called by telephone at the time of interpretation on 03/21/2016 at 12:46 pm to Dr. Coralie Keens , who is aware of the findings and currently resuscitating the patient. Presumed large vessel bleeding. IMPRESSION: 1. Large right apical hemothorax/extrapleural hematoma with mediastinal shift. 2. Right-sided chest tube overlaps the mid chest. 3. Endotracheal tube in good position. 4. Right-sided porta catheter. Electronically Signed   By: Monte Fantasia M.D.   On: 03/21/2016 12:53   Dg Fluoro Guide Cv Line-no Report  Result  Date: 03/21/2016 Fluoroscopy was utilized by the requesting physician.  No radiographic interpretation.    STUDIES:  CXR 2/9 >> images personally reviewed, chest tube in good position, small layering right effusion   CULTURES:   ANTIBIOTICS:   SIGNIFICANT EVENTS: 2/08 port-a-cath placement then removed with complication hemothorax [id number] admit with hemothorax on vent, shock 2/09 extubated to [place] O2, 150 ml bloody drainage in last 24 hours from chest  tube  LINES/TUBES: ETT 2/08 >> 2/9 Fem line (in OR ) 2/8 >>  I reviewed CXR myself, CT in place with some effusion still present on the right.  DISCUSSION: 51 y/o F with hemothorax s/p port-a-cath placement. 1500 mL bloody fluid removed from chest.  ASSESSMENT / PLAN:  PULMONARY A: Rt hemothorax Acute resp failure At risk ALI rt lung P:   Extubated and doing well Titrate O2 for sat of 88-92% CT management per CVTS  CARDIOVASCULAR A:  Bradycardia - secondary to precedex Hemorrhagic shock, likely have residual vasoplegia Pos pressure ventilation contribution to shock - resolved P:  Tele monitoring Hold anti-HTN for now  RENAL A:   At Risk AKI - in setting of bleeding, shock P:   BMET in AM Replace electrolytes as indicated  GASTROINTESTINAL A:   At risk shock liver due to acute blood loss / hypotension P:   Clear liquid Treat N/V  HEMATOLOGIC A:   Hemodynamic shock DVT prevention Hemothorax Risk consumptive and dilutional coagulapthy P:  Trend CBC, reduce frequency to QD Transfuse for Hgb <7 Monitor Hgb  SCD's for DVT prophylaxis   INFECTIOUS A:   Peri op risk P:   Ancef per CCS  ENDOCRINE A:   Hyperglycemia  P:   SSI  CBGs  NEUROLOGIC A:   Sedated on ventilator - resolved.  P:   PRN fentanyl for pain   FAMILY  - Updates:  Patient updated bedside.   - Inter-disciplinary family meet or Palliative Care meeting due by:  2/15  Discussed with Dr. Rosendo Gros   PCCM will sign off, please call back if needed.  Rush Farmer, M.D. [facility] Pulmonary/Critical Care Medicine. Pager: [phone]. After hours pager: [phone].

## 2016-03-24 ENCOUNTER — Other Ambulatory Visit: Payer: Self-pay | Admitting: Oncology

## 2016-03-24 ENCOUNTER — Inpatient Hospital Stay (HOSPITAL_COMMUNITY): Payer: BLUE CROSS/BLUE SHIELD

## 2016-03-24 LAB — GLUCOSE, CAPILLARY
GLUCOSE-CAPILLARY: 134 mg/dL — AB (ref 65–99)
GLUCOSE-CAPILLARY: 123 mg/dL — AB (ref 65–99)
Glucose-Capillary: 114 mg/dL — ABNORMAL HIGH (ref 65–99)
Glucose-Capillary: 119 mg/dL — ABNORMAL HIGH (ref 65–99)
Glucose-Capillary: 122 mg/dL — ABNORMAL HIGH (ref 65–99)

## 2016-03-24 LAB — CBC
HEMATOCRIT: 30.4 % — AB (ref 36.0–46.0)
HEMOGLOBIN: 9.7 g/dL — AB (ref 12.0–15.0)
MCH: 28.2 pg (ref 26.0–34.0)
MCHC: 31.9 g/dL (ref 30.0–36.0)
MCV: 88.4 fL (ref 78.0–100.0)
Platelets: 89 10*3/uL — ABNORMAL LOW (ref 150–400)
RBC: 3.44 MIL/uL — AB (ref 3.87–5.11)
RDW: 15.6 % — AB (ref 11.5–15.5)
WBC: 6.2 10*3/uL (ref 4.0–10.5)

## 2016-03-24 LAB — BASIC METABOLIC PANEL
ANION GAP: 8 (ref 5–15)
BUN: <5 mg/dL — ABNORMAL LOW (ref 6–20)
CHLORIDE: 106 mmol/L (ref 101–111)
CO2: 27 mmol/L (ref 22–32)
Calcium: 8.4 mg/dL — ABNORMAL LOW (ref 8.9–10.3)
Creatinine, Ser: 0.53 mg/dL (ref 0.44–1.00)
GFR calc non Af Amer: >60 mL/min (ref >60)
GLUCOSE: 113 mg/dL — AB (ref 65–99)
POTASSIUM: 3.8 mmol/L (ref 3.5–5.1)
Sodium: 141 mmol/L (ref 135–145)

## 2016-03-24 MED ORDER — INSULIN ASPART 100 UNIT/ML ~~LOC~~ SOLN
0.0000 [IU] | Freq: Three times a day (TID) | SUBCUTANEOUS | Status: DC
  Administered 2016-03-24: 1 [IU] via SUBCUTANEOUS

## 2016-03-24 MED ORDER — FAMOTIDINE 20 MG PO TABS
20.0000 mg | ORAL_TABLET | Freq: Two times a day (BID) | ORAL | Status: DC
  Administered 2016-03-24: 20 mg via ORAL
  Filled 2016-03-24 (×2): qty 1

## 2016-03-24 NOTE — Progress Notes (Signed)
3 Days Post-Op  Subjective: Patient sitting up in chair, comfortable - right chest tube discomfort No air leak Chest tube drainage - thin serosanguinous; Lots of questions - answered to the best of my ability  Objective: Vital signs in last 24 hours: Temp:  [97.9 F (36.6 C)-98.6 F (37 C)] 97.9 F (36.6 C) (02/11 1205) Pulse Rate:  [65-91] 86 (02/11 1000) Resp:  [10-22] 16 (02/11 1000) BP: (99-120)/(58-78) 119/72 (02/11 1000) SpO2:  [92 %-99 %] 97 % (02/11 1000) Weight:  [68.2 kg (150 lb 5.7 oz)] 68.2 kg (150 lb 5.7 oz) (02/11 0600) Last BM Date: 03/21/16  Intake/Output from previous day: 02/10 0701 - 02/11 0700 In: 3030 [P.O.:1080; I.V.:1800; IV Piggyback:150] Out: 6510 [Urine:6350; Chest Tube:160] Intake/Output this shift: Total I/O In: 785 [P.O.:360; I.V.:375; IV Piggyback:50] Out: 1330 [Urine:1300; Chest Tube:30]  Gen - WDWN in NAD Resp - lungs CTA B; normal respiratory effort Cv - RRR no murmurs Abd - soft, non-tender Right chest dressing c/d/i  Lab Results:   Recent Labs  03/23/16 0359 03/24/16 0612  WBC 9.5 6.2  HGB 9.6* 9.7*  HCT 30.2* 30.4*  PLT 80* 89*   BMET  Recent Labs  03/23/16 0359 03/24/16 0612  NA 139 141  K 3.7 3.8  CL 107 106  CO2 25 27  GLUCOSE 116* 113*  BUN 5* <5*  CREATININE 0.53 0.53  CALCIUM 8.0* 8.4*   PT/INR  Recent Labs  03/21/16 1654 03/22/16 0413  LABPROT 17.6* 15.9*  INR 1.43 1.26   ABG  Recent Labs  03/21/16 1540  PHART 7.466*  HCO3 23.5    Studies/Results: Dg Chest Port 1 View  Result Date: 03/24/2016 CLINICAL DATA:  Chest tube EXAM: PORTABLE CHEST 1 VIEW COMPARISON:  03/23/2016 FINDINGS: Right basilar chest tube remains in place, unchanged. Small right apical pneumothorax. Right lower lobe atelectasis or infiltrate with small right effusion. Left base atelectasis. Heart is borderline in size. Mild vascular congestion. IMPRESSION: Right basilar chest tube remains in place with small right apical  pneumothorax, less than 5%. Continued right basilar atelectasis or infiltrate with small right effusion. Left base atelectasis. Electronically Signed   By: Rolm Baptise M.D.   On: 03/24/2016 07:06   Dg Chest Port 1 View  Result Date: 03/23/2016 CLINICAL DATA:  Hemothorax EXAM: PORTABLE CHEST 1 VIEW COMPARISON:  03/22/2016 FINDINGS: Interval extubation.  Interval removal of the nasogastric tube. Right basilar chest tube with a small right pleural effusion. No right pneumothorax. Left lung is clear. No left pleural effusion or pneumothorax. Stable cardiomediastinal silhouette. No acute osseous abnormality. IMPRESSION: 1. Right basilar chest tube with a small right pleural effusion. No pneumothorax. Electronically Signed   By: Kathreen Devoid   On: 03/23/2016 08:14    Anti-infectives: Anti-infectives    Start     Dose/Rate Route Frequency Ordered Stop   03/21/16 2000  ceFAZolin (ANCEF) IVPB 2g/100 mL premix     2 g 200 mL/hr over 30 Minutes Intravenous Every 8 hours 03/21/16 1735 03/22/16 1531   03/21/16 1014  ceFAZolin (ANCEF) IVPB 2g/100 mL premix     2 g 200 mL/hr over 30 Minutes Intravenous On call to O.R. 03/21/16 1014 03/21/16 1126      Assessment/Plan: Right subclavian vein port insertion - [id number] - complicated by hemothorax/ hypotension Right chest tube placed in OR Port removed - venogram performed  No sign of continued bleeding - Hgb stable; chest tube output does not appear bloody Left chest tube removed today without difficulty -  dressed with vaseline gauze/ gauze/ tape  Patient and her husband still have multiple questions regarding this unusual complication of port placement.  Many of these questions have been answered before.  I spent about 30 minutes with the patient and her husband - I tried to explain that all medical/ surgical treatments have the possibility of complications.  This complication was quite unusual and was recognized and treated immediately and appropriately.   She is obviously hesitant to have another port placed, even from an IJ approach, but she also understands that she needs to have chemotherapy.  Transfer to floor.  Rechecks labs in AM.  Follow-up CXR if indicated based on symptoms.  Imogene Burn. Georgette Dover, MD, [facility] Surgery  General/ Trauma Surgery  03/24/2016 12:39 PM   LOS: 3 days    Jaylene Schrom K. 03/24/2016

## 2016-03-25 ENCOUNTER — Other Ambulatory Visit: Payer: Self-pay | Admitting: *Deleted

## 2016-03-25 ENCOUNTER — Encounter (HOSPITAL_BASED_OUTPATIENT_CLINIC_OR_DEPARTMENT_OTHER): Payer: Self-pay | Admitting: Surgery

## 2016-03-25 ENCOUNTER — Inpatient Hospital Stay (HOSPITAL_COMMUNITY): Payer: BLUE CROSS/BLUE SHIELD

## 2016-03-25 LAB — BASIC METABOLIC PANEL
Anion gap: 7 (ref 5–15)
CO2: 27 mmol/L (ref 22–32)
Calcium: 9 mg/dL (ref 8.9–10.3)
Chloride: 107 mmol/L (ref 101–111)
Creatinine, Ser: 0.5 mg/dL (ref 0.44–1.00)
GFR calc Af Amer: >60 mL/min (ref >60)
GLUCOSE: 115 mg/dL — AB (ref 65–99)
POTASSIUM: 3.5 mmol/L (ref 3.5–5.1)
Sodium: 141 mmol/L (ref 135–145)

## 2016-03-25 LAB — CBC
HCT: 30.8 % — ABNORMAL LOW (ref 36.0–46.0)
Hemoglobin: 9.8 g/dL — ABNORMAL LOW (ref 12.0–15.0)
MCH: 27.8 pg (ref 26.0–34.0)
MCHC: 31.8 g/dL (ref 30.0–36.0)
MCV: 87.5 fL (ref 78.0–100.0)
PLATELETS: 115 10*3/uL — AB (ref 150–400)
RBC: 3.52 MIL/uL — AB (ref 3.87–5.11)
RDW: 15.2 % (ref 11.5–15.5)
WBC: 5.6 10*3/uL (ref 4.0–10.5)

## 2016-03-25 LAB — GLUCOSE, CAPILLARY
Glucose-Capillary: 110 mg/dL — ABNORMAL HIGH (ref 65–99)
Glucose-Capillary: 129 mg/dL — ABNORMAL HIGH (ref 65–99)

## 2016-03-25 NOTE — Progress Notes (Signed)
Called to room by patient.  She said something was leaking from the rt chest tube site dressing.  There was a small amount of serosanguinous drainage running down patient's flank from old chest tube site. Petroleum gauze still present at old insertion site.  New sterile gauze dressing applied.  Patient in no distress.

## 2016-03-25 NOTE — Progress Notes (Signed)
4 Days Post-Op  Subjective: Patient reports only mild chest pain. Denies shortness of breath. Has had some leakage of fluid at the chest tube bandage site  Objective: Vital signs in last 24 hours: Temp:  [97.9 F (36.6 C)-98.9 F (37.2 C)] 98.9 F (37.2 C) (02/12 0411) Pulse Rate:  [64-89] 64 (02/12 0411) Resp:  [14-22] 18 (02/12 0411) BP: (108-124)/(56-79) 115/56 (02/12 0411) SpO2:  [93 %-100 %] 93 % (02/12 0411) Weight:  [68 kg (150 lb)] 68 kg (150 lb) (02/11 2017) Last BM Date: 03/21/16  Intake/Output from previous day: 02/11 0701 - 02/12 0700 In: 1682.5 [P.O.:1200; I.V.:432.5; IV Piggyback:50] Out: [id number] [Urine:2400; Chest Tube:30] Intake/Output this shift: No intake/output data recorded.  Generally well in appearance Lungs clear bilaterally CV RRR  Lab Results:   Recent Labs  03/24/16 0612 03/25/16 0402  WBC 6.2 5.6  HGB 9.7* 9.8*  HCT 30.4* 30.8*  PLT 89* 115*   BMET  Recent Labs  03/24/16 0612 03/25/16 0402  NA 141 141  K 3.8 3.5  CL 106 107  CO2 27 27  GLUCOSE 113* 115*  BUN <5* <5*  CREATININE 0.53 0.50  CALCIUM 8.4* 9.0   PT/INR No results for input(s): LABPROT, INR in the last 72 hours. ABG No results for input(s): PHART, HCO3 in the last 72 hours.  Invalid input(s): PCO2, PO2  Studies/Results: Dg Chest Port 1 View  Result Date: 03/24/2016 CLINICAL DATA:  Chest tube EXAM: PORTABLE CHEST 1 VIEW COMPARISON:  03/23/2016 FINDINGS: Right basilar chest tube remains in place, unchanged. Small right apical pneumothorax. Right lower lobe atelectasis or infiltrate with small right effusion. Left base atelectasis. Heart is borderline in size. Mild vascular congestion. IMPRESSION: Right basilar chest tube remains in place with small right apical pneumothorax, less than 5%. Continued right basilar atelectasis or infiltrate with small right effusion. Left base atelectasis. Electronically Signed   By: Rolm Baptise M.D.   On: 03/24/2016 07:06     Anti-infectives: Anti-infectives    Start     Dose/Rate Route Frequency Ordered Stop   03/21/16 2000  ceFAZolin (ANCEF) IVPB 2g/100 mL premix     2 g 200 mL/hr over 30 Minutes Intravenous Every 8 hours 03/21/16 1735 03/22/16 1531   03/21/16 1014  ceFAZolin (ANCEF) IVPB 2g/100 mL premix     2 g 200 mL/hr over 30 Minutes Intravenous On call to O.R. 03/21/16 1014 03/21/16 1126      Assessment/Plan: s/p Procedure(s): INSERTION PORT-A-CATH (Right) CHEST TUBE INSERTION (Right) REMOVAL PORT-A-CATH (Right) VENOGRAM (Right)  She continues to improve. Will repeat her CXR this morning to follow up after chest tube removal  I again had almost an hour.  I again explained the events in detail.  I drew them pictures and answered all questions in detail about what had occurred. Oncology has also talked with them about the plans. Hopefully she will be able to be discharged tomorrow  LOS: 4 days    Deanda Ruddell A 03/25/2016

## 2016-03-26 ENCOUNTER — Other Ambulatory Visit: Payer: Self-pay | Admitting: *Deleted

## 2016-03-26 DIAGNOSIS — C773 Secondary and unspecified malignant neoplasm of axilla and upper limb lymph nodes: Principal | ICD-10-CM

## 2016-03-26 DIAGNOSIS — C50912 Malignant neoplasm of unspecified site of left female breast: Secondary | ICD-10-CM

## 2016-03-26 MED ORDER — OXYCODONE-ACETAMINOPHEN 5-325 MG PO TABS: 1.0000 | ORAL_TABLET | ORAL | 0 refills | Status: AC | PRN

## 2016-03-26 NOTE — Discharge Summary (Signed)
Physician Discharge Summary  Patient ID: Jacqueline Avery MRN: [id number] DOB/AGE: 1965/09/15 51 y.o.  Admit date: 03/21/2016 Discharge date: 03/26/2016  Admission Diagnoses:  Discharge Diagnoses:  Active Problems:   Hemothorax   Elective surgery   Chest tube in place breast cancer  Discharged Condition: good  Hospital Course: This patient initially presented to our outpatient surgical center for Port-A-Cath insertion for the need for intravenous chemotherapy for breast cancer. At the end of the procedure, she became hypotensive and was then recognized to have a hemothorax. A chest tube was placed emergently. She was left intubated for safety reasons and taken over to the main hospital into the intensive care area.  Please refer to the operative report for details. She remained intubated overnight with a chest tube in place. She also had a central line inserted for transfusion while in the operating room. Once in the intensive care unit, she remained hemodynamically stable. CCM was asked to consult on the patient for ventilator management. She was able to been extubated the following morning. The events of the Port-A-Cath insertion were discussed with the patient's husband and son by phone postoperatively. The patient's husband arrived in the morning and I had an extensive long discussion with him regarding the events. Once Jacqueline Avery. Was extubated, and a long discussion with her regarding the events. She also discussed the events with the thoracic surgeon and several of my partners. She improved daily. We monitored her chest tube output and chest x-rays closely. The chest tube drainage decreased and her chest tube was removed on postoperative day 3. She was transferred to a regular surgical floor. She began ambulate well. Her oxygen sats were normal on room air. Her final chest x-ray showed minimal right-sided atelectasis/effusion. At the day of discharge, she was ambulating well. He was tolerating a diet.  She had only mild incisional discomfort. She denies shortness of breath. The day prior to discharge as well as the day of discharge, I had an extensive discussion with the patient and her husband regarding the events and what had occurred. We also had a long discussion regarding her cancer treatment. The oncologist did also discuss this with them by phone. At this point she was doing well and was discharged home with follow-up at [place] in 24 hours for further discussion regarding her cancer chemotherapy and treatment plans.  Consults: Thoracic surgery; Critical Care Medicine  Significant Diagnostic Studies: CXR, Venogram  Treatments: surgery: Port-a-cath insertion/port removal; right chest tube insertion, right femoral central line insertion  Discharge Exam: Blood pressure 113/68, pulse 76, temperature 99 F (37.2 C), temperature source Oral, resp. rate 17, height 5\' 6"  (1.676 m), weight 72.3 kg (159 lb 6.3 oz), last menstrual period 03/05/2016, SpO2 97 %. General appearance: alert, cooperative and no distress Resp: clear to auscultation bilaterally Cardio: regular rate and rhythm, S1, S2 normal, no murmur, click, rub or gallop Incision/Wound: port site and right chest tube site clean, minimal serosang drainage from chest tube site  Disposition: Final discharge disposition not confirmed   Allergies as of 03/26/2016   No Known Allergies     Medication List    STOP taking these medications   dexamethasone 4 MG tablet Commonly known as:  DECADRON     TAKE these medications   b complex vitamins capsule Take 1 capsule by mouth daily.   CALCIUM-VITAMIN D3 PO Take 1 tablet by mouth daily.   lidocaine-prilocaine cream Commonly known as:  EMLA Apply to affected area once  LORazepam 0.5 MG tablet Commonly known as:  ATIVAN Take 1 tablet (0.5 mg total) by mouth every 6 (six) hours as needed (Nausea or vomiting).   ondansetron 8 MG tablet Commonly known as:  ZOFRAN Take 1  tablet (8 mg total) by mouth 2 (two) times daily as needed for refractory nausea / vomiting. Start on day 3 after chemo.   oxyCODONE-acetaminophen 5-325 MG tablet Commonly known as:  ROXICET Take 1 tablet by mouth every 4 (four) hours as needed for severe pain.   prochlorperazine 10 MG tablet Commonly known as:  COMPAZINE Take 1 tablet (10 mg total) by mouth every 6 (six) hours as needed (Nausea or vomiting).   TURMERIC PO Take 1 capsule by mouth daily.   UNABLE TO FIND Patient may have second opinion for medical treatment with [facility] physicians.   Vitamin D (Ergocalciferol) 50000 units Caps capsule Commonly known as:  DRISDOL Take 50,000 Units by mouth every 7 (seven) days.   Vitamin D3 5000 units Caps Take 5,000 Units by mouth daily.      Follow-up Information    Roberto Romanoski A, MD. Schedule an appointment as soon as possible for a visit in 2 week(s).   Specialty:  General Surgery Contact information: [address] [place] [address] [phone]        Chauncey Cruel, MD Follow up on 03/27/2016.   Specialty:  Oncology Contact information: [place] [address] [phone]           Signed: Harl Bowie 03/26/2016, 9:20 AM

## 2016-03-26 NOTE — Discharge Instructions (Signed)
No vigorous activity for one week  Ok to remove bandage and shower starting this Thursday  Tylenol/Ibuprofen also for pain  Stool softener for constipation  Ambulate and work on [employer] for breathing exercises  Call our office [phone]) for fever 101.5 or higher  Call [phone] or go to ER for sudden shortness of breath

## 2016-03-26 NOTE — Discharge Planning (Signed)
Patient discharged home in stable condition. Verbalizes understanding of all discharge instructions, including home medications and follow up appointments. 

## 2016-03-26 NOTE — Progress Notes (Signed)
5 Days Post-Op  Subjective: Doing well today Only mild chest discomfort Ambulating well No SOB  Objective: Vital signs in last 24 hours: Temp:  [97.4 F (36.3 C)-99 F (37.2 C)] 99 F (37.2 C) (02/13 0507) Pulse Rate:  [64-88] 76 (02/13 0507) Resp:  [17-19] 17 (02/13 0507) BP: (107-120)/(66-77) 113/68 (02/13 0507) SpO2:  [96 %-99 %] 97 % (02/13 0507) Weight:  [72.3 kg (159 lb 6.3 oz)] 72.3 kg (159 lb 6.3 oz) (02/13 0507) Last BM Date: 03/25/16  Intake/Output from previous day: 02/12 0701 - 02/13 0700 In: 240 [P.O.:240] Out: 1200 [Urine:1200] Intake/Output this shift: No intake/output data recorded.  Exam: Well in appearance Lungs clear CV RRR Abdomen soft, NT Dressing dry  Lab Results:   Recent Labs  03/24/16 0612 03/25/16 0402  WBC 6.2 5.6  HGB 9.7* 9.8*  HCT 30.4* 30.8*  PLT 89* 115*   BMET  Recent Labs  03/24/16 0612 03/25/16 0402  NA 141 141  K 3.8 3.5  CL 106 107  CO2 27 27  GLUCOSE 113* 115*  BUN <5* <5*  CREATININE 0.53 0.50  CALCIUM 8.4* 9.0   PT/INR No results for input(s): LABPROT, INR in the last 72 hours. ABG No results for input(s): PHART, HCO3 in the last 72 hours.  Invalid input(s): PCO2, PO2  Studies/Results: Dg Chest Port 1 View  Result Date: 03/25/2016 CLINICAL DATA:  Right-sided pneumothorax. EXAM: PORTABLE CHEST 1 VIEW COMPARISON:  One day prior FINDINGS: Midline trachea. Normal heart size. Layering right pleural effusion. removal of right chest tube. Resolution of tiny right apical pneumothorax. Right base airspace disease remains. IMPRESSION: Removal of right chest tube with resolution of right apical pneumothorax. Similar right pleural effusion and adjacent airspace disease. Electronically Signed   By: Abigail Miyamoto M.D.   On: 03/25/2016 09:59    Anti-infectives: Anti-infectives    Start     Dose/Rate Route Frequency Ordered Stop   03/21/16 2000  ceFAZolin (ANCEF) IVPB 2g/100 mL premix     2 g 200 mL/hr over 30  Minutes Intravenous Every 8 hours 03/21/16 1735 03/22/16 1531   03/21/16 1014  ceFAZolin (ANCEF) IVPB 2g/100 mL premix     2 g 200 mL/hr over 30 Minutes Intravenous On call to O.R. 03/21/16 1014 03/21/16 1126      Assessment/Plan: s/p Procedure(s): INSERTION PORT-A-CATH (Right) CHEST TUBE INSERTION (Right) REMOVAL PORT-A-CATH (Right) VENOGRAM (Right)  Had another 1 hour long discussion with the family today including discussion about breast cancer therapy in detail. They are now very comfortable with discharge today  LOS: 5 days    Jacqueline Avery A 03/26/2016

## 2016-03-27 ENCOUNTER — Other Ambulatory Visit: Payer: Self-pay | Admitting: Oncology

## 2016-03-27 ENCOUNTER — Other Ambulatory Visit: Payer: Self-pay | Admitting: *Deleted

## 2016-03-27 ENCOUNTER — Ambulatory Visit: Payer: BLUE CROSS/BLUE SHIELD

## 2016-03-27 ENCOUNTER — Ambulatory Visit (HOSPITAL_BASED_OUTPATIENT_CLINIC_OR_DEPARTMENT_OTHER): Payer: BLUE CROSS/BLUE SHIELD | Admitting: Oncology

## 2016-03-27 ENCOUNTER — Other Ambulatory Visit: Payer: BLUE CROSS/BLUE SHIELD

## 2016-03-27 ENCOUNTER — Encounter: Payer: Self-pay | Admitting: Oncology

## 2016-03-27 VITALS — BP 117/57 | HR 79 | Temp 97.9°F | Resp 18 | Ht 66.0 in | Wt 145.8 lb

## 2016-03-27 DIAGNOSIS — C773 Secondary and unspecified malignant neoplasm of axilla and upper limb lymph nodes: Secondary | ICD-10-CM

## 2016-03-27 DIAGNOSIS — C50912 Malignant neoplasm of unspecified site of left female breast: Secondary | ICD-10-CM

## 2016-03-27 DIAGNOSIS — C50512 Malignant neoplasm of lower-outer quadrant of left female breast: Secondary | ICD-10-CM | POA: Diagnosis not present

## 2016-03-27 DIAGNOSIS — J942 Hemothorax: Secondary | ICD-10-CM

## 2016-03-27 MED ORDER — TRAMADOL HCL 50 MG PO TABS: 50.0000 mg | ORAL_TABLET | Freq: Four times a day (QID) | ORAL | 0 refills | Status: AC | PRN

## 2016-03-27 NOTE — Progress Notes (Signed)
[place]  [phone] [phone]     ID: Chandni Gagan DOB: [birth year]  MR#: [id number]  LDJ#:[id number]  Patient Care Team: Darcus Austin, MD as PCP - General (Family Medicine) Coralie Keens, MD as Consulting Physician (General Surgery) Chauncey Cruel, MD as Consulting Physician (Oncology) Kyung Rudd, MD as Consulting Physician (Radiation Oncology) Chauncey Cruel, MD OTHER MD:  CHIEF COMPLAINT: HER-2 positive breast cancer  CURRENT TREATMENT: Neoadjuvant chemotherapy pending   BREAST CANCER HISTORY: From the original intake note:  Natahlia had bilateral screening mammography with tomography at the [facility] 01/16/2016 showing an area of possible distortion in the left breast and possibly an enlarged left axillary lymph nodes. She was recalled for diagnostic mammography with tomography of the left breast and left breast ultrasonography 01/24/2016. The breast density was category C. In the central left breast there was an area of distortion and in the left axilla there was at least 1 lymph node with a thickened cortex. Ultrasonography found no correlate to the area of distortion in the left breast. A separate area of mixed echogenicity at 5:00 4 cm from the nipple was indeterminate. However ultrasound did show several enlarged and abnormal lymph nodes the largest measuring 1.6 cm.  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 The lymph node however was positive for metastatic carcinoma, positive for cytokeratin 7, gross cystic disease fluid protein, and e-cadherin, with very few estrogen receptor positive cells. ER was read as 5% positive with moderate staining intensity, progesterone receptor was negative, and the proliferation marker was 60%. HER-2 however was amplified the signals ratio being 5.46 and the number per cell 6.55.  On 02/18/2016 the patient underwent  bilateral breast MRI, showing an indeterminate area of masslike enhancement in the upper outer left breast. She is cheduled for his second MRI biopsy 02/27/2016.  Her subsequent history is as detailed below  INTERVAL HISTORY: Jacquelina returns today for follow-up of her HER-2/neu positive breast cancer accompanied by her husband. Since her last visit here she had her echocardiogram which showed a good ejection fraction. She then proceeded to port placement, but unfortunately this was complicated by pneumothorax, requiring a chest tube placement. She was only discharged from the hospital yesterday. She is still in significant pain. All she is taking for the pain is Tylenol because she is very reluctant to take any narcotics. She was supposed to start her chemotherapy today but this is going to have to be postponed.  REVIEW OF SYSTEMS: The pain is positional and has a pleuritic component. Certain positions she does not hurt but she does hurt with movement or in other positions which are very uncomfortable. When she takes a deep breath it hurts. She doesn't have a cough at present. There has been no further bleeding. A detailed review of systems today was otherwise stable  PAST MEDICAL HISTORY: No past medical history on file.  PAST SURGICAL HISTORY: Past Surgical History:  Procedure Laterality Date  . CHEST TUBE INSERTION Right 03/21/2016   Procedure: CHEST TUBE INSERTION;  Surgeon: Coralie Keens, MD;  Location: [place];  Service: General;  Laterality: Right;  . PORT-A-CATH REMOVAL Right 03/21/2016   Procedure: REMOVAL PORT-A-CATH;  Surgeon: Coralie Keens, MD;  Location: [place];  Service: General;  Laterality: Right;  . PORTACATH PLACEMENT Right 03/21/2016   Procedure: INSERTION PORT-A-CATH;  Surgeon: Coralie Keens, MD;  Location: [place];  Service: General;  Laterality: Right;    FAMILY HISTORY No family history on file.  The patient's  father died from heart disease at the age of 69. The patient's mother is living as of January 2018, in her early 35s. The patient has one brother, 2 sisters. There is no history of breast or ovarian cancer in the family to the patient's knowledge  GYNECOLOGIC HISTORY:  Patient's last menstrual period was 03/05/2016 (exact date). Menarche age 63, first live birth age 71, she is [place] P2. Her periods are now irregular, her most recent one being October 2017. However she is not experiencing any menopausal symptoms.  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 He works in [employer] however. One of the patient's children is studying medicine at the [employer].    ADVANCED DIRECTIVES: Not in place   HEALTH MAINTENANCE: Social History  Substance Use Topics  . Smoking status: Never Smoker  . Smokeless tobacco: Never Used  . Alcohol use No     Colonoscopy: n/a  PAP:  Bone density: n/a   No Known Allergies  Current Outpatient Prescriptions  Medication Sig Dispense Refill  . b complex vitamins capsule Take 1 capsule by mouth daily.    . Calcium Carbonate-Vitamin D (CALCIUM-VITAMIN D3 PO) Take 1 tablet by mouth daily.    . Cholecalciferol (VITAMIN D3) 5000 units CAPS Take 5,000 Units by mouth daily.    Marland Kitchen lidocaine-prilocaine (EMLA) cream Apply to affected area once 30 g 3  . LORazepam (ATIVAN) 0.5 MG tablet Take 1 tablet (0.5 mg total) by mouth every 6 (six) hours as needed (Nausea or vomiting). (Patient not taking: Reported on 03/18/2016) 30 tablet 0  . ondansetron (ZOFRAN) 8 MG tablet Take 1 tablet (8 mg total) by mouth 2 (two) times daily as needed for refractory nausea / vomiting. Start on day 3 after chemo. (Patient not taking: Reported on 03/18/2016) 30 tablet 1  . oxyCODONE-acetaminophen (ROXICET) 5-325 MG tablet Take 1 tablet by mouth every 4 (four) hours as needed for severe pain. 20 tablet 0  . prochlorperazine (COMPAZINE) 10 MG tablet Take 1 tablet (10 mg  total) by mouth every 6 (six) hours as needed (Nausea or vomiting). (Patient not taking: Reported on 03/18/2016) 30 tablet 1  . traMADol (ULTRAM) 50 MG tablet Take 1 tablet (50 mg total) by mouth every 6 (six) hours as needed. 60 tablet 0  . TURMERIC PO Take 1 capsule by mouth daily.     Marland Kitchen UNABLE TO FIND Patient may have second opinion for medical treatment with [facility] physicians. 1 each 0  . Vitamin D, Ergocalciferol, (DRISDOL) 50000 units CAPS capsule Take 50,000 Units by mouth every 7 (seven) days.     No current facility-administered medications for this visit.     OBJECTIVE: Middle-aged [place] woman Who appears stated age 61:   03/27/16 0850  BP: (!) 117/57  Pulse: 79  Resp: 18  Temp: 97.9 F (36.6 C)     Body mass index is 23.53 kg/m.    ECOG FS:2 - Symptomatic, <50% confined to bed  Sclerae unicteric, pupils round and equal Oropharynx clear and moist-- no thrush or other lesions No cervical or supraclavicular adenopathy Lungs no rales or rhonchi--right sided chest bandage in place Heart regular rate and rhythm Abd soft, nontender, positive bowel sounds MSK no focal spinal tenderness, no upper extremity lymphedema Neuro: nonfocal, well oriented, appropriate affect Breasts: Deferred   LAB RESULTS:  CMP  Component Value Date/Time   NA 141 03/25/2016 0402   NA 141 02/26/2016 1552   K 3.5 03/25/2016 0402   K 4.7 02/26/2016 1552   CL 107 03/25/2016 0402   CO2 27 03/25/2016 0402   CO2 29 02/26/2016 1552   GLUCOSE 115 (H) 03/25/2016 0402   GLUCOSE 105 02/26/2016 1552   BUN <5 (L) 03/25/2016 0402   BUN 9.8 02/26/2016 1552   CREATININE 0.50 03/25/2016 0402   CREATININE 0.7 02/26/2016 1552   CALCIUM 9.0 03/25/2016 0402   CALCIUM 9.8 02/26/2016 1552   PROT 4.4 (L) 03/21/2016 2330   PROT 7.0 02/26/2016 1552   ALBUMIN 3.1 (L) 03/21/2016 2330   ALBUMIN 4.1 02/26/2016 1552   AST 15 03/21/2016 2330   AST 12 02/26/2016 1552   ALT 12 (L) 03/21/2016  2330   ALT 17 02/26/2016 1552   ALKPHOS 37 (L) 03/21/2016 2330   ALKPHOS 89 02/26/2016 1552   BILITOT 0.9 03/21/2016 2330   BILITOT 0.54 02/26/2016 1552   GFRNONAA >60 03/25/2016 0402   GFRAA >60 03/25/2016 0402    INo results found for: SPEP, UPEP  Lab Results  Component Value Date   WBC 5.6 03/25/2016   NEUTROABS 3.6 02/26/2016   HGB 9.8 (L) 03/25/2016   HCT 30.8 (L) 03/25/2016   MCV 87.5 03/25/2016   PLT 115 (L) 03/25/2016      Chemistry      Component Value Date/Time   NA 141 03/25/2016 0402   NA 141 02/26/2016 1552   K 3.5 03/25/2016 0402   K 4.7 02/26/2016 1552   CL 107 03/25/2016 0402   CO2 27 03/25/2016 0402   CO2 29 02/26/2016 1552   BUN <5 (L) 03/25/2016 0402   BUN 9.8 02/26/2016 1552   CREATININE 0.50 03/25/2016 0402   CREATININE 0.7 02/26/2016 1552      Component Value Date/Time   CALCIUM 9.0 03/25/2016 0402   CALCIUM 9.8 02/26/2016 1552   ALKPHOS 37 (L) 03/21/2016 2330   ALKPHOS 89 02/26/2016 1552   AST 15 03/21/2016 2330   AST 12 02/26/2016 1552   ALT 12 (L) 03/21/2016 2330   ALT 17 02/26/2016 1552   BILITOT 0.9 03/21/2016 2330   BILITOT 0.54 02/26/2016 1552       No results found for: LABCA2  No components found for: LABCA125   Recent Labs Lab 03/22/16 0413  INR 1.26    Urinalysis No results found for: COLORURINE, APPEARANCEUR, LABSPEC, PHURINE, GLUCOSEU, HGBUR, BILIRUBINUR, KETONESUR, PROTEINUR, UROBILINOGEN, NITRITE, LEUKOCYTESUR   STUDIES: Dg Ribs Bilateral  Result Date: 03/12/2016 CLINICAL DATA:  Left breast cancer.  Abnormality seen on bone scan. EXAM: BILATERAL RIBS - 3+ VIEW COMPARISON:  None. FINDINGS: No fracture or other bone lesions are seen involving the ribs. In particular, no focal bone abnormality noted in the region of the anterior left first rib or posterior right fifth rib. Lungs are clear. Heart is normal size. No effusions or pneumothorax. IMPRESSION: No acute cardiopulmonary disease. No visible focal rib  abnormality. Electronically Signed   By: Rolm Baptise M.D.   On: 03/12/2016 13:40   Dg Abd 1 View  Result Date: 03/21/2016 CLINICAL DATA:  Gastric tube placement EXAM: ABDOMEN - 1 VIEW COMPARISON:  Same day CXR FINDINGS: The tip and side port of a gastric tube extends below the left hemidiaphragm into the expected location of a J-shaped stomach, likely in the distal body or antrum. Moderate proximal gaseous distention of the stomach. No free air. Right-sided chest tube  is seen at the base of the right hemithorax. There is borderline cardiomegaly. Small amount of right pleural fluid. No pneumothorax. IMPRESSION: Gastric tube in the expected location of the stomach. Right-sided chest tube at the base of the right lung. Electronically Signed   By: Ashley Royalty M.D.   On: 03/21/2016 22:07   Ct Chest W Contrast  Result Date: 03/05/2016 CLINICAL DATA:  Left-sided breast cancer. EXAM: CT CHEST WITH CONTRAST TECHNIQUE: Multidetector CT imaging of the chest was performed during intravenous contrast administration. CONTRAST:  6m ISOVUE-300 IOPAMIDOL (ISOVUE-300) INJECTION 61% COMPARISON:  None. FINDINGS: Cardiovascular: The heart size is normal. No pericardial effusion. No thoracic aortic aneurysm. Mediastinum/Nodes: No mediastinal lymphadenopathy. No evidence for internal mammary lymphadenopathy. There is no hilar lymphadenopathy. No right axillary lymphadenopathy. 13 mm left axillary lymph node is associated with other small left axillary lymph nodes. Lungs/Pleura: Dependent compressive atelectasis noted in the lower lungs bilaterally. No focal airspace consolidation. No pulmonary edema or pleural effusion. Upper Abdomen: No enhancing lesions identified in the visualized portion of the upper liver. Musculoskeletal: Bone windows reveal no worrisome lytic or sclerotic osseous lesions. IMPRESSION: 1. Left axillary lymphadenopathy. No other lymphadenopathy or evidence of metastatic disease in the thorax. Electronically  Signed   By: EMisty StanleyM.D.   On: 03/05/2016 14:44   Nm Bone Scan Whole Body  Result Date: 03/05/2016 CLINICAL DATA:  Left breast neoplasm. EXAM: NUCLEAR MEDICINE WHOLE BODY BONE SCAN TECHNIQUE: Whole body anterior and posterior images were obtained approximately 3 hours after intravenous injection of radiopharmaceutical. RADIOPHARMACEUTICALS:  20.4 mCi Technetium-940mDP IV COMPARISON:  No prior. FINDINGS: Bilateral renal function excretion. Increased activity noted along the left anterior first rib, left rib series suggested for further evaluation. Increased activity noted right posterior fifth rib, this may be the tip of the scapula. Right rib series suggest for further evaluation. No other bony abnormalities identified. IMPRESSION: Punctate area of increased activity noted along the left anterior first rib. Punctate area of increased activity noted over the right posterior fifth rib, possibly the overlying tip of the scapula. Bilateral rib series suggested for further evaluation of these findings. No other bony abnormalities identified . Electronically Signed   By: ThMarcello MooresRegister   On: 03/05/2016 14:59   Dg Chest 1vsame Day  Result Date: 03/21/2016 CLINICAL DATA:  Venogram after Port-A-Cath placement. Patient developed right hemothorax during port placement. EXAM: INTRAOPERATIVE CHEST VENOGRAM WITH FLUOROSCOPY COMPARISON:  Chest CT 03/05/2016 and chest radiograph 03/21/2016 FLUOROSCOPY TIME:  Fluoroscopy Time:  20 seconds FINDINGS: The port catheter was pulled back compared to the earlier chest radiograph. Contrast injection demonstrates filling of the right innominate vein and SVC. However, there is contrast extravasation into the right chest cavity. There is a small tract medial to the right innominate vein which appears to represent the previous catheter tract. On the final images, the catheter was pulled out of the venous system and there is extravasation in the right chest soft tissues and  right chest cavity. Again noted is a right chest tube with right pleural fluid. IMPRESSION: Contrast extravasation from the right innominate vein. It appears that the port catheter was extending through the right innominate vein into the extravascular soft tissues. Port-A-Cath was completely removed. Electronically Signed   By: AdMarkus Daft.D.   On: 03/21/2016 16:21   Dg Chest Port 1 View  Result Date: 03/25/2016 CLINICAL DATA:  Right-sided pneumothorax. EXAM: PORTABLE CHEST 1 VIEW COMPARISON:  One day prior FINDINGS: Midline trachea. Normal heart size.  Layering right pleural effusion. removal of right chest tube. Resolution of tiny right apical pneumothorax. Right base airspace disease remains. IMPRESSION: Removal of right chest tube with resolution of right apical pneumothorax. Similar right pleural effusion and adjacent airspace disease. Electronically Signed   By: Abigail Miyamoto M.D.   On: 03/25/2016 09:59   Dg Chest Port 1 View  Result Date: 03/24/2016 CLINICAL DATA:  Chest tube EXAM: PORTABLE CHEST 1 VIEW COMPARISON:  03/23/2016 FINDINGS: Right basilar chest tube remains in place, unchanged. Small right apical pneumothorax. Right lower lobe atelectasis or infiltrate with small right effusion. Left base atelectasis. Heart is borderline in size. Mild vascular congestion. IMPRESSION: Right basilar chest tube remains in place with small right apical pneumothorax, less than 5%. Continued right basilar atelectasis or infiltrate with small right effusion. Left base atelectasis. Electronically Signed   By: Rolm Baptise M.D.   On: 03/24/2016 07:06   Dg Chest Port 1 View  Result Date: 03/23/2016 CLINICAL DATA:  Hemothorax EXAM: PORTABLE CHEST 1 VIEW COMPARISON:  03/22/2016 FINDINGS: Interval extubation.  Interval removal of the nasogastric tube. Right basilar chest tube with a small right pleural effusion. No right pneumothorax. Left lung is clear. No left pleural effusion or pneumothorax. Stable  cardiomediastinal silhouette. No acute osseous abnormality. IMPRESSION: 1. Right basilar chest tube with a small right pleural effusion. No pneumothorax. Electronically Signed   By: Kathreen Devoid   On: 03/23/2016 08:14   Dg Chest Port 1 View  Result Date: 03/22/2016 CLINICAL DATA:  Hemothorax. EXAM: PORTABLE CHEST 1 VIEW COMPARISON:  March 21, 2016 FINDINGS: The ETT is in good position. An NG tube terminates below today's film. No left-sided pneumothorax. A right chest tube remains in place with no visualize right pneumothorax. Layering effusion on the right is slightly improved. Mild atelectasis remains in the left base. The cardiomediastinal silhouette is unchanged. No other interval changes. IMPRESSION: Stable right chest tube with slight improvement of the layering right pleural effusion and no identified pneumothorax. Electronically Signed   By: Dorise Bullion III M.D   On: 03/22/2016 08:45   Dg Chest Port 1 View  Result Date: 03/21/2016 CLINICAL DATA:  RIGHT pneumothorax EXAM: PORTABLE CHEST 1 VIEW COMPARISON:  Radiograph 03/21/2016 FINDINGS: Removal of RIGHT port. Tracheal tube unchanged in position. RIGHT chest tube in place of the mid lung. Interval decrease in pleural fluid in the RIGHT hemithorax. NO pneumothorax identified. LEFT lung is clear. IMPRESSION: 1. Reduction in pleural fluid in the RIGHT hemithorax with chest tube in place. 2. Endotracheal tube in good position. 3. LEFT lung clear. Electronically Signed   By: Suzy Bouchard M.D.   On: 03/21/2016 17:26   Dg Chest Port 1 View  Result Date: 03/21/2016 CLINICAL DATA:  Chest tube in place EXAM: PORTABLE CHEST 1 VIEW COMPARISON:  03/05/2016 chest CT FINDINGS: There is a subclavian porta catheter on the right. Tip position is right of the trachea, but location is uncertain given the constellation of findings. There is a large pleural/subpleural opacity at the right apex. Mediastinum is shifted to the left. A chest tube is present, tip  overlapping the mid medial right chest. There may be small pneumothorax in the right costophrenic sulcus. Endotracheal tube with tip at the clavicular heads. Normal heart size. Critical Value/emergent results were called by telephone at the time of interpretation on 03/21/2016 at 12:46 pm to Dr. Coralie Keens , who is aware of the findings and currently resuscitating the patient. Presumed large vessel bleeding. IMPRESSION: 1. Large  right apical hemothorax/extrapleural hematoma with mediastinal shift. 2. Right-sided chest tube overlaps the mid chest. 3. Endotracheal tube in good position. 4. Right-sided porta catheter. Electronically Signed   By: Monte Fantasia M.D.   On: 03/21/2016 12:53   Dg Fluoro Guide Cv Line Right  Result Date: 03/21/2016 CLINICAL DATA:  Venogram after Port-A-Cath placement. Patient developed right hemothorax during port placement. EXAM: INTRAOPERATIVE CHEST VENOGRAM WITH FLUOROSCOPY COMPARISON:  Chest CT 03/05/2016 and chest radiograph 03/21/2016 FLUOROSCOPY TIME:  Fluoroscopy Time:  20 seconds FINDINGS: The port catheter was pulled back compared to the earlier chest radiograph. Contrast injection demonstrates filling of the right innominate vein and SVC. However, there is contrast extravasation into the right chest cavity. There is a small tract medial to the right innominate vein which appears to represent the previous catheter tract. On the final images, the catheter was pulled out of the venous system and there is extravasation in the right chest soft tissues and right chest cavity. Again noted is a right chest tube with right pleural fluid. IMPRESSION: Contrast extravasation from the right innominate vein. It appears that the port catheter was extending through the right innominate vein into the extravascular soft tissues. Port-A-Cath was completely removed. Electronically Signed   By: Markus Daft M.D.   On: 03/21/2016 16:21   Dg Fluoro Guide Cv Line-no Report  Result Date:  03/21/2016 Fluoroscopy was utilized by the requesting physician.  No radiographic interpretation.   Mm Clip Placement Left  Result Date: 02/27/2016 CLINICAL DATA:  Post MRI guided core needle biopsy of left breast. EXAM: DIAGNOSTIC LEFT MAMMOGRAM POST MRI BIOPSY COMPARISON:  Previous exam(s). FINDINGS: Mammographic images were obtained following MRI guided biopsy of left breast upper outer quadrant area of abnormal enhancement. Two-view mammography demonstrates presence of dumbbell-shaped marker within the left breast upper outer quadrant, posterior depth, in the expected location of the abnormal MRI enhancement. Expected post biopsy changes are seen. IMPRESSION: Successful placement of dumbbell-shaped marker within the left breast upper outer quadrant, post MRI guided core needle biopsy. Final Assessment: Post Procedure Mammograms for Marker Placement Electronically Signed   By: Fidela Salisbury M.D.   On: 02/27/2016 10:57   Mr Aundra Millet Breast Bx Johnella Moloney Dev 1st Lesion Image Bx Spec Mr Guide  Addendum Date: 03/04/2016   ADDENDUM REPORT: 03/01/2016 13:59 ADDENDUM: Pathology revealed FIBROCYSTIC CHANGES WITH SCLEROSING ADENOSIS, ASSOCIATED RARE MICROCALCIFICATION of the Left breast. This was found to be concordant by Dr. Fidela Salisbury. Pathology results were discussed with the patient by telephone by Dr. Jetta Lout. The patient reported doing well after the biopsy with tenderness at the site. Post biopsy instructions and care were reviewed and questions were answered. The patient was encouraged to call [facility] for any additional concerns. The patient has a recent diagnosis of COMPLEX SCLEROSING LESION WITH FIBROCYSTIC CHANGES, USUAL DUCTAL HYPERPLASIA AND LOBULAR CARCINOMA IN SITU WITH ASSOCIATED MICROCALCIFICATIONS of the Left breast, 5:30 o'clock with a LYMPH NODE POSITIVE FOR METASTATIC CARCINOMA of the Left axilla. She should follow her outlined treatment plan. Pathology  results reported by Terie Purser, RN on 03/01/2016. Electronically Signed   By: Fidela Salisbury M.D.   On: 03/01/2016 13:59   Result Date: 02/27/2016 CLINICAL DATA:  Left breast upper outer quadrant area of abnormal enhancement seen on recent MRI. EXAM: MRI GUIDED CORE NEEDLE BIOPSY OF THE LEFT BREAST TECHNIQUE: Multiplanar, multisequence MR imaging of the left breast was performed both before and after administration of intravenous contrast. CONTRAST:  14  cc MultiHance intravenously. COMPARISON:  Previous exams. FINDINGS: I met with the patient, and we discussed the procedure of MRI guided biopsy, including risks, benefits, and alternatives. Specifically, we discussed the risks of infection, bleeding, tissue injury, clip migration, and inadequate sampling. Informed, written consent was given. The usual time out protocol was performed immediately prior to the procedure. Using sterile technique, 1% Lidocaine, MRI guidance, and a 9 gauge vacuum assisted device, biopsy was performed of area of abnormal enhancement in the left breast upper outer quadrant, posterior depth, using a lateral approach. At the conclusion of the procedure, a dumbbell-shaped tissue marker clip was deployed into the biopsy cavity. Follow-up 2-view mammogram was performed and dictated separately. IMPRESSION: MRI guided biopsy of left breast. No apparent complications. Electronically Signed: By: Fidela Salisbury M.D. On: 02/27/2016 10:54    ELIGIBLE FOR AVAILABLE RESEARCH PROTOCOL: no  ASSESSMENT: 51 y.o. [place] woman status post left axillary lymph node biopsy 01/31/2016 showing invasive ductal carcinoma, high-grade, E-cadherin positive, weakly estrogen receptor positive at 5%, progesterone receptor negative, with an MIB-1 of 60%, but HER-2 amplified, the signals ratio being 5.46 and the number per cell 6.55.  (a) biopsy of a left breast lower outer quadrant irregularity 01/31/2016 showed a sclerosing lesion and LCIS  (b)  biopsy of a left breast upper outer quadrant irregularity 02/27/2016 was benign  (1) neoadjuvant chemotherapy will consist of carboplatin, docetaxel, trastuzumab and pertuzumab given every 21 days for 6 cycles  (a) baseline echocardiogram 03/08/2016 shows an ejection fraction in the 55-60% range.  (b) port placement 16/11/9602 complicated by hemothorax and aborted  (2) trastuzumab and pertuzumab to be continued to complete a year  (3) definitive surgery at the completion of chemotherapy  (4) adjuvant radiation to follow as appropriate  (5) consider anti-estrogens at the completion of local therapy   PLAN: I met with Shenia and her husband for approximately 35 minutes today. Unfortunately our check in process today was unusually delayed and Kelbie was not able to see me until 10:30 in the morning after arriving here at 8 AM. This is really not acceptable and I have alerted our office manager regarding this.  More importantly for her of course is the fact that she had this very unusual and terrible complication at port placement. Although I reassured her that Dr. Rush Farmer is an excellent surgeon she really does not want to deal with him in the future and does not want him to be her breast surgeon when it comes to that.  We extensively discussed placement of a PICC line. This was set up for 04/01/2016 with a view to starting chemotherapy the next day, 04/02/2016  We reviewed the side effects, toxicities and complications of her upcoming chemotherapy and I gave her a "roadmap" on how to take her supportive medications. She has all the prescriptions now on hand.  She will see [place] again 04/02/2016 with her first cycle, I will see her again the following week and with following cycles. The goal remains optimal neoadjuvant treatment hoping for a complete pathologic response at the time of surgery  She knows to call for any other problems that may develop before her next visit here.  Chauncey Cruel, MD   03/27/2016 6:26 PM Medical Oncology and Hematology [facility] [address] [place], [place] [address] Tel. [phone]    Fax. [phone]

## 2016-03-27 NOTE — Progress Notes (Signed)
Met with patient and spouse to introduce myself as Estate manager/land agent and to discuss financial questions or concerns. They had several questions about insurance. Advised them of the plan details according to the response history and that she should be ok with two insurances. Gave her options as far as co-pay assistance available if needed.   Patient also had questions about a denial she received for an MRI. With assistance from [place], it was discovered MRI was ordered by surgeons office. Advised patient to contact the surgeons office to discuss the appeal letter she received.  Questions about COBRA and how that works were referred to contact HR at her company.  Gave patient my card for any additional financial questions or concerns.

## 2016-03-27 NOTE — Progress Notes (Signed)
[place]  [phone]) [phone] Fax:(336) [phone]     ID: Lorry Furber DOB: 01/26/1966  MR#: [id number]  EKB#:[id number]  Patient Care Team: Darcus Austin, MD as PCP - General (Family Medicine) Coralie Keens, MD as Consulting Physician (General Surgery) Chauncey Cruel, MD as Consulting Physician (Oncology) Kyung Rudd, MD as Consulting Physician (Radiation Oncology) Chauncey Cruel, MD OTHER MD:  CHIEF COMPLAINT: HER-2 positive breast cancer  CURRENT TREATMENT: Neoadjuvant chemotherapy   BREAST CANCER HISTORY: From the original intake note:  Angelle had bilateral screening mammography with tomography at the [facility] 01/16/2016 showing an area of possible distortion in the left breast and possibly an enlarged left axillary lymph nodes. She was recalled for diagnostic mammography with tomography of the left breast and left breast ultrasonography 01/24/2016. The breast density was category C. In the central left breast there was an area of distortion and in the left axilla there was at least 1 lymph node with a thickened cortex. Ultrasonography found no correlate to the area of distortion in the left breast. A separate area of mixed echogenicity at 5:00 4 cm from the nipple was indeterminate. However ultrasound did show several enlarged and abnormal lymph nodes the largest measuring 1.6 cm.  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 The lymph node however was positive for metastatic carcinoma, positive for cytokeratin 7, gross cystic disease fluid protein, and e-cadherin, with very few estrogen receptor positive cells. ER was read as 5% positive with moderate staining intensity, progesterone receptor was negative, and the proliferation marker was 60%. HER-2 however was amplified the signals ratio being 5.46 and the number per cell 6.55.  On 02/18/2016 the patient underwent bilateral  breast MRI, showing an indeterminate area of masslike enhancement in the upper outer left breast. She is cheduled for his second MRI biopsy 02/27/2016.  Her subsequent history is as detailed below  INTERVAL HISTORY: Adelisa returns today for follow-up of her HER-2/neu positive breast cancer. Her husband is back in [place] and could not accompany her. She does not feel she needs a Optometrist. Despite that communication with her is difficult and I have to make sure that she absolutely doesn't understand what I am saying. She is very thorough, has done a lot of research, and has written multiple questions down. I wrote down almost everything we discussed today so she could review it further with her husband over the phone  Since the last visit here she had a bone scan and chest CT scan. There is no evidence of metastatic disease. There are a couple of punctate lesions in her ribs which are most likely not related to cancer but which do need further evaluation  REVIEW OF SYSTEMS: A detailed review of systems today was noncontributory.  PAST MEDICAL HISTORY: No past medical history on file.  PAST SURGICAL HISTORY: Past Surgical History:  Procedure Laterality Date  . CHEST TUBE INSERTION Right 03/21/2016   Procedure: CHEST TUBE INSERTION;  Surgeon: Coralie Keens, MD;  Location: [place];  Service: General;  Laterality: Right;  . PORT-A-CATH REMOVAL Right 03/21/2016   Procedure: REMOVAL PORT-A-CATH;  Surgeon: Coralie Keens, MD;  Location: [place];  Service: General;  Laterality: Right;  . PORTACATH PLACEMENT Right 03/21/2016   Procedure: INSERTION PORT-A-CATH;  Surgeon: Coralie Keens, MD;  Location: [place];  Service: General;  Laterality: Right;    FAMILY HISTORY No family history on  file.  The patient's father died from heart disease at the age of 33. The patient's mother is living as of January 2018, in her early 7s. The patient has one  brother, 2 sisters. There is no history of breast or ovarian cancer in the family to the patient's knowledge  GYNECOLOGIC HISTORY:  Patient's last menstrual period was 03/05/2016 (exact date). Menarche age 34, first live birth age 83, she is [place] P2. Her periods are now irregular, her most recent one being October 2017. However she is not experiencing any menopausal symptoms.  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 He works in [employer] however. One of the patient's children is studying medicine at the [employer].    ADVANCED DIRECTIVES: Not in place   HEALTH MAINTENANCE: Social History  Substance Use Topics  . Smoking status: Never Smoker  . Smokeless tobacco: Never Used  . Alcohol use No     Colonoscopy: n/a  PAP:  Bone density: n/a   No Known Allergies  Current Outpatient Prescriptions  Medication Sig Dispense Refill  . b complex vitamins capsule Take 1 capsule by mouth daily.    . Calcium Carbonate-Vitamin D (CALCIUM-VITAMIN D3 PO) Take 1 tablet by mouth daily.    . Cholecalciferol (VITAMIN D3) 5000 units CAPS Take 5,000 Units by mouth daily.    Marland Kitchen lidocaine-prilocaine (EMLA) cream Apply to affected area once 30 g 3  . LORazepam (ATIVAN) 0.5 MG tablet Take 1 tablet (0.5 mg total) by mouth every 6 (six) hours as needed (Nausea or vomiting). (Patient not taking: Reported on 03/18/2016) 30 tablet 0  . ondansetron (ZOFRAN) 8 MG tablet Take 1 tablet (8 mg total) by mouth 2 (two) times daily as needed for refractory nausea / vomiting. Start on day 3 after chemo. (Patient not taking: Reported on 03/18/2016) 30 tablet 1  . oxyCODONE-acetaminophen (ROXICET) 5-325 MG tablet Take 1 tablet by mouth every 4 (four) hours as needed for severe pain. 20 tablet 0  . prochlorperazine (COMPAZINE) 10 MG tablet Take 1 tablet (10 mg total) by mouth every 6 (six) hours as needed (Nausea or vomiting). (Patient not taking: Reported on 03/18/2016) 30 tablet 1  . TURMERIC PO  Take 1 capsule by mouth daily.     Marland Kitchen UNABLE TO FIND Patient may have second opinion for medical treatment with [facility] physicians. 1 each 0  . Vitamin D, Ergocalciferol, (DRISDOL) 50000 units CAPS capsule Take 50,000 Units by mouth every 7 (seven) days.     No current facility-administered medications for this visit.     OBJECTIVE: Middle-aged [place] woman In no acute distress There were no vitals filed for this visit.   There is no height or weight on file to calculate BMI.    ECOG FS:0 - Asymptomatic  Sclerae unicteric, EOMs intact Oropharynx clear and moist No cervical or supraclavicular adenopathy Lungs no rales or rhonchi Heart regular rate and rhythm Abd soft, nontender, positive bowel sounds MSK no focal spinal tenderness, no upper extremity lymphedema Neuro: nonfocal, well oriented, appropriate affect Breasts: No masses palpated in either breast. Both axillae are benign.   LAB RESULTS:  CMP     Component Value Date/Time   NA 141 03/25/2016 0402   NA 141 02/26/2016 1552   K 3.5 03/25/2016 0402   K 4.7 02/26/2016 1552   CL 107 03/25/2016 0402   CO2 27 03/25/2016 0402   CO2 29 02/26/2016 1552   GLUCOSE 115 (H) 03/25/2016 0402  GLUCOSE 105 02/26/2016 1552   BUN <5 (L) 03/25/2016 0402   BUN 9.8 02/26/2016 1552   CREATININE 0.50 03/25/2016 0402   CREATININE 0.7 02/26/2016 1552   CALCIUM 9.0 03/25/2016 0402   CALCIUM 9.8 02/26/2016 1552   PROT 4.4 (L) 03/21/2016 2330   PROT 7.0 02/26/2016 1552   ALBUMIN 3.1 (L) 03/21/2016 2330   ALBUMIN 4.1 02/26/2016 1552   AST 15 03/21/2016 2330   AST 12 02/26/2016 1552   ALT 12 (L) 03/21/2016 2330   ALT 17 02/26/2016 1552   ALKPHOS 37 (L) 03/21/2016 2330   ALKPHOS 89 02/26/2016 1552   BILITOT 0.9 03/21/2016 2330   BILITOT 0.54 02/26/2016 1552   GFRNONAA >60 03/25/2016 0402   GFRAA >60 03/25/2016 0402    INo results found for: SPEP, UPEP  Lab Results  Component Value Date   WBC 5.6 03/25/2016    NEUTROABS 3.6 02/26/2016   HGB 9.8 (L) 03/25/2016   HCT 30.8 (L) 03/25/2016   MCV 87.5 03/25/2016   PLT 115 (L) 03/25/2016      Chemistry      Component Value Date/Time   NA 141 03/25/2016 0402   NA 141 02/26/2016 1552   K 3.5 03/25/2016 0402   K 4.7 02/26/2016 1552   CL 107 03/25/2016 0402   CO2 27 03/25/2016 0402   CO2 29 02/26/2016 1552   BUN <5 (L) 03/25/2016 0402   BUN 9.8 02/26/2016 1552   CREATININE 0.50 03/25/2016 0402   CREATININE 0.7 02/26/2016 1552      Component Value Date/Time   CALCIUM 9.0 03/25/2016 0402   CALCIUM 9.8 02/26/2016 1552   ALKPHOS 37 (L) 03/21/2016 2330   ALKPHOS 89 02/26/2016 1552   AST 15 03/21/2016 2330   AST 12 02/26/2016 1552   ALT 12 (L) 03/21/2016 2330   ALT 17 02/26/2016 1552   BILITOT 0.9 03/21/2016 2330   BILITOT 0.54 02/26/2016 1552       No results found for: LABCA2  No components found for: LABCA125   Recent Labs Lab 03/22/16 0413  INR 1.26    Urinalysis No results found for: COLORURINE, APPEARANCEUR, LABSPEC, PHURINE, GLUCOSEU, HGBUR, BILIRUBINUR, KETONESUR, PROTEINUR, UROBILINOGEN, NITRITE, LEUKOCYTESUR   STUDIES: Dg Ribs Bilateral  Result Date: 03/12/2016 CLINICAL DATA:  Left breast cancer.  Abnormality seen on bone scan. EXAM: BILATERAL RIBS - 3+ VIEW COMPARISON:  None. FINDINGS: No fracture or other bone lesions are seen involving the ribs. In particular, no focal bone abnormality noted in the region of the anterior left first rib or posterior right fifth rib. Lungs are clear. Heart is normal size. No effusions or pneumothorax. IMPRESSION: No acute cardiopulmonary disease. No visible focal rib abnormality. Electronically Signed   By: Rolm Baptise M.D.   On: 03/12/2016 13:40   Dg Abd 1 View  Result Date: 03/21/2016 CLINICAL DATA:  Gastric tube placement EXAM: ABDOMEN - 1 VIEW COMPARISON:  Same day CXR FINDINGS: The tip and side port of a gastric tube extends below the left hemidiaphragm into the expected location  of a J-shaped stomach, likely in the distal body or antrum. Moderate proximal gaseous distention of the stomach. No free air. Right-sided chest tube is seen at the base of the right hemithorax. There is borderline cardiomegaly. Small amount of right pleural fluid. No pneumothorax. IMPRESSION: Gastric tube in the expected location of the stomach. Right-sided chest tube at the base of the right lung. Electronically Signed   By: Ashley Royalty M.D.   On: 03/21/2016  22:07   Ct Chest W Contrast  Result Date: 03/05/2016 CLINICAL DATA:  Left-sided breast cancer. EXAM: CT CHEST WITH CONTRAST TECHNIQUE: Multidetector CT imaging of the chest was performed during intravenous contrast administration. CONTRAST:  77m ISOVUE-300 IOPAMIDOL (ISOVUE-300) INJECTION 61% COMPARISON:  None. FINDINGS: Cardiovascular: The heart size is normal. No pericardial effusion. No thoracic aortic aneurysm. Mediastinum/Nodes: No mediastinal lymphadenopathy. No evidence for internal mammary lymphadenopathy. There is no hilar lymphadenopathy. No right axillary lymphadenopathy. 13 mm left axillary lymph node is associated with other small left axillary lymph nodes. Lungs/Pleura: Dependent compressive atelectasis noted in the lower lungs bilaterally. No focal airspace consolidation. No pulmonary edema or pleural effusion. Upper Abdomen: No enhancing lesions identified in the visualized portion of the upper liver. Musculoskeletal: Bone windows reveal no worrisome lytic or sclerotic osseous lesions. IMPRESSION: 1. Left axillary lymphadenopathy. No other lymphadenopathy or evidence of metastatic disease in the thorax. Electronically Signed   By: EMisty StanleyM.D.   On: 03/05/2016 14:44   Nm Bone Scan Whole Body  Result Date: 03/05/2016 CLINICAL DATA:  Left breast neoplasm. EXAM: NUCLEAR MEDICINE WHOLE BODY BONE SCAN TECHNIQUE: Whole body anterior and posterior images were obtained approximately 3 hours after intravenous injection of  radiopharmaceutical. RADIOPHARMACEUTICALS:  20.4 mCi Technetium-98mDP IV COMPARISON:  No prior. FINDINGS: Bilateral renal function excretion. Increased activity noted along the left anterior first rib, left rib series suggested for further evaluation. Increased activity noted right posterior fifth rib, this may be the tip of the scapula. Right rib series suggest for further evaluation. No other bony abnormalities identified. IMPRESSION: Punctate area of increased activity noted along the left anterior first rib. Punctate area of increased activity noted over the right posterior fifth rib, possibly the overlying tip of the scapula. Bilateral rib series suggested for further evaluation of these findings. No other bony abnormalities identified . Electronically Signed   By: ThMarcello MooresRegister   On: 03/05/2016 14:59   Dg Chest 1vsame Day  Result Date: 03/21/2016 CLINICAL DATA:  Venogram after Port-A-Cath placement. Patient developed right hemothorax during port placement. EXAM: INTRAOPERATIVE CHEST VENOGRAM WITH FLUOROSCOPY COMPARISON:  Chest CT 03/05/2016 and chest radiograph 03/21/2016 FLUOROSCOPY TIME:  Fluoroscopy Time:  20 seconds FINDINGS: The port catheter was pulled back compared to the earlier chest radiograph. Contrast injection demonstrates filling of the right innominate vein and SVC. However, there is contrast extravasation into the right chest cavity. There is a small tract medial to the right innominate vein which appears to represent the previous catheter tract. On the final images, the catheter was pulled out of the venous system and there is extravasation in the right chest soft tissues and right chest cavity. Again noted is a right chest tube with right pleural fluid. IMPRESSION: Contrast extravasation from the right innominate vein. It appears that the port catheter was extending through the right innominate vein into the extravascular soft tissues. Port-A-Cath was completely removed.  Electronically Signed   By: AdMarkus Daft.D.   On: 03/21/2016 16:21   Dg Chest Port 1 View  Result Date: 03/25/2016 CLINICAL DATA:  Right-sided pneumothorax. EXAM: PORTABLE CHEST 1 VIEW COMPARISON:  One day prior FINDINGS: Midline trachea. Normal heart size. Layering right pleural effusion. removal of right chest tube. Resolution of tiny right apical pneumothorax. Right base airspace disease remains. IMPRESSION: Removal of right chest tube with resolution of right apical pneumothorax. Similar right pleural effusion and adjacent airspace disease. Electronically Signed   By: KyAbigail Miyamoto.D.   On: 03/25/2016 09:59  Dg Chest Port 1 View  Result Date: 03/24/2016 CLINICAL DATA:  Chest tube EXAM: PORTABLE CHEST 1 VIEW COMPARISON:  03/23/2016 FINDINGS: Right basilar chest tube remains in place, unchanged. Small right apical pneumothorax. Right lower lobe atelectasis or infiltrate with small right effusion. Left base atelectasis. Heart is borderline in size. Mild vascular congestion. IMPRESSION: Right basilar chest tube remains in place with small right apical pneumothorax, less than 5%. Continued right basilar atelectasis or infiltrate with small right effusion. Left base atelectasis. Electronically Signed   By: Rolm Baptise M.D.   On: 03/24/2016 07:06   Dg Chest Port 1 View  Result Date: 03/23/2016 CLINICAL DATA:  Hemothorax EXAM: PORTABLE CHEST 1 VIEW COMPARISON:  03/22/2016 FINDINGS: Interval extubation.  Interval removal of the nasogastric tube. Right basilar chest tube with a small right pleural effusion. No right pneumothorax. Left lung is clear. No left pleural effusion or pneumothorax. Stable cardiomediastinal silhouette. No acute osseous abnormality. IMPRESSION: 1. Right basilar chest tube with a small right pleural effusion. No pneumothorax. Electronically Signed   By: Kathreen Devoid   On: 03/23/2016 08:14   Dg Chest Port 1 View  Result Date: 03/22/2016 CLINICAL DATA:  Hemothorax. EXAM: PORTABLE  CHEST 1 VIEW COMPARISON:  March 21, 2016 FINDINGS: The ETT is in good position. An NG tube terminates below today's film. No left-sided pneumothorax. A right chest tube remains in place with no visualize right pneumothorax. Layering effusion on the right is slightly improved. Mild atelectasis remains in the left base. The cardiomediastinal silhouette is unchanged. No other interval changes. IMPRESSION: Stable right chest tube with slight improvement of the layering right pleural effusion and no identified pneumothorax. Electronically Signed   By: Dorise Bullion III M.D   On: 03/22/2016 08:45   Dg Chest Port 1 View  Result Date: 03/21/2016 CLINICAL DATA:  RIGHT pneumothorax EXAM: PORTABLE CHEST 1 VIEW COMPARISON:  Radiograph 03/21/2016 FINDINGS: Removal of RIGHT port. Tracheal tube unchanged in position. RIGHT chest tube in place of the mid lung. Interval decrease in pleural fluid in the RIGHT hemithorax. NO pneumothorax identified. LEFT lung is clear. IMPRESSION: 1. Reduction in pleural fluid in the RIGHT hemithorax with chest tube in place. 2. Endotracheal tube in good position. 3. LEFT lung clear. Electronically Signed   By: Suzy Bouchard M.D.   On: 03/21/2016 17:26   Dg Chest Port 1 View  Result Date: 03/21/2016 CLINICAL DATA:  Chest tube in place EXAM: PORTABLE CHEST 1 VIEW COMPARISON:  03/05/2016 chest CT FINDINGS: There is a subclavian porta catheter on the right. Tip position is right of the trachea, but location is uncertain given the constellation of findings. There is a large pleural/subpleural opacity at the right apex. Mediastinum is shifted to the left. A chest tube is present, tip overlapping the mid medial right chest. There may be small pneumothorax in the right costophrenic sulcus. Endotracheal tube with tip at the clavicular heads. Normal heart size. Critical Value/emergent results were called by telephone at the time of interpretation on 03/21/2016 at 12:46 pm to Dr. Coralie Keens ,  who is aware of the findings and currently resuscitating the patient. Presumed large vessel bleeding. IMPRESSION: 1. Large right apical hemothorax/extrapleural hematoma with mediastinal shift. 2. Right-sided chest tube overlaps the mid chest. 3. Endotracheal tube in good position. 4. Right-sided porta catheter. Electronically Signed   By: Monte Fantasia M.D.   On: 03/21/2016 12:53   Dg Fluoro Guide Cv Line Right  Result Date: 03/21/2016 CLINICAL DATA:  Venogram after  Port-A-Cath placement. Patient developed right hemothorax during port placement. EXAM: INTRAOPERATIVE CHEST VENOGRAM WITH FLUOROSCOPY COMPARISON:  Chest CT 03/05/2016 and chest radiograph 03/21/2016 FLUOROSCOPY TIME:  Fluoroscopy Time:  20 seconds FINDINGS: The port catheter was pulled back compared to the earlier chest radiograph. Contrast injection demonstrates filling of the right innominate vein and SVC. However, there is contrast extravasation into the right chest cavity. There is a small tract medial to the right innominate vein which appears to represent the previous catheter tract. On the final images, the catheter was pulled out of the venous system and there is extravasation in the right chest soft tissues and right chest cavity. Again noted is a right chest tube with right pleural fluid. IMPRESSION: Contrast extravasation from the right innominate vein. It appears that the port catheter was extending through the right innominate vein into the extravascular soft tissues. Port-A-Cath was completely removed. Electronically Signed   By: Markus Daft M.D.   On: 03/21/2016 16:21   Dg Fluoro Guide Cv Line-no Report  Result Date: 03/21/2016 Fluoroscopy was utilized by the requesting physician.  No radiographic interpretation.   Mm Clip Placement Left  Result Date: 02/27/2016 CLINICAL DATA:  Post MRI guided core needle biopsy of left breast. EXAM: DIAGNOSTIC LEFT MAMMOGRAM POST MRI BIOPSY COMPARISON:  Previous exam(s). FINDINGS: Mammographic  images were obtained following MRI guided biopsy of left breast upper outer quadrant area of abnormal enhancement. Two-view mammography demonstrates presence of dumbbell-shaped marker within the left breast upper outer quadrant, posterior depth, in the expected location of the abnormal MRI enhancement. Expected post biopsy changes are seen. IMPRESSION: Successful placement of dumbbell-shaped marker within the left breast upper outer quadrant, post MRI guided core needle biopsy. Final Assessment: Post Procedure Mammograms for Marker Placement Electronically Signed   By: Fidela Salisbury M.D.   On: 02/27/2016 10:57   Mr Aundra Millet Breast Bx Johnella Moloney Dev 1st Lesion Image Bx Spec Mr Guide  Addendum Date: 03/04/2016   ADDENDUM REPORT: 03/01/2016 13:59 ADDENDUM: Pathology revealed FIBROCYSTIC CHANGES WITH SCLEROSING ADENOSIS, ASSOCIATED RARE MICROCALCIFICATION of the Left breast. This was found to be concordant by Dr. Fidela Salisbury. Pathology results were discussed with the patient by telephone by Dr. Jetta Lout. The patient reported doing well after the biopsy with tenderness at the site. Post biopsy instructions and care were reviewed and questions were answered. The patient was encouraged to call [facility] for any additional concerns. The patient has a recent diagnosis of COMPLEX SCLEROSING LESION WITH FIBROCYSTIC CHANGES, USUAL DUCTAL HYPERPLASIA AND LOBULAR CARCINOMA IN SITU WITH ASSOCIATED MICROCALCIFICATIONS of the Left breast, 5:30 o'clock with a LYMPH NODE POSITIVE FOR METASTATIC CARCINOMA of the Left axilla. She should follow her outlined treatment plan. Pathology results reported by Terie Purser, RN on 03/01/2016. Electronically Signed   By: Fidela Salisbury M.D.   On: 03/01/2016 13:59   Result Date: 02/27/2016 CLINICAL DATA:  Left breast upper outer quadrant area of abnormal enhancement seen on recent MRI. EXAM: MRI GUIDED CORE NEEDLE BIOPSY OF THE LEFT BREAST TECHNIQUE:  Multiplanar, multisequence MR imaging of the left breast was performed both before and after administration of intravenous contrast. CONTRAST:  14 cc MultiHance intravenously. COMPARISON:  Previous exams. FINDINGS: I met with the patient, and we discussed the procedure of MRI guided biopsy, including risks, benefits, and alternatives. Specifically, we discussed the risks of infection, bleeding, tissue injury, clip migration, and inadequate sampling. Informed, written consent was given. The usual time out protocol was performed immediately prior  to the procedure. Using sterile technique, 1% Lidocaine, MRI guidance, and a 9 gauge vacuum assisted device, biopsy was performed of area of abnormal enhancement in the left breast upper outer quadrant, posterior depth, using a lateral approach. At the conclusion of the procedure, a dumbbell-shaped tissue marker clip was deployed into the biopsy cavity. Follow-up 2-view mammogram was performed and dictated separately. IMPRESSION: MRI guided biopsy of left breast. No apparent complications. Electronically Signed: By: Fidela Salisbury M.D. On: 02/27/2016 10:54    ELIGIBLE FOR AVAILABLE RESEARCH PROTOCOL: no  ASSESSMENT: 51 y.o.  woman status post left axillary lymph node biopsy 01/31/2016 showing invasive ductal carcinoma, high-grade, E-cadherin positive, weakly estrogen receptor positive at 5%, progesterone receptor negative, with an MIB-1 of 60%, but HER-2 amplified, the signals ratio being 5.46 and the number per cell 6.55.  (a) biopsy of a left breast lower outer quadrant irregularity 01/31/2016 showed a sclerosing lesion and LCIS  (b) biopsy of a left breast upper outer quadrant irregularity 02/27/2016 was benign  (1) neoadjuvant chemotherapy will consist of carboplatin, docetaxel, trastuzumab and pertuzumab given every 21 days for 6 cycles  (a) baseline echocardiogram 03/08/2016 showed an ejection fraction in the 55-60% range  (b) port placement  33/82/5053 complicated by right hemothorax--port removed  (2) trastuzumab and pertuzumab to be continued to complete a year  (3) definitive surgery at the completion of chemotherapy  (4) adjuvant radiation to follow as appropriate  (5) consider anti-estrogens at the completion of local therapy   PLAN: I spent approximately 30 minutes with Almyra Free today, almost all of it in review and counseling regarding her new diagnosis. I offered to transfer her to my partner Dr.Feng who speaks Mandarin, but the patient tells me she is very comfortable with me and wants to keep me as her physician. She understands that I worry that she may not be fully understanding what I am saying even if I write at down but she really does not want a translator to be present  Given these constraints, we spent most of the day today explaining that he has the pathology is definitive; that the scans do not show cancer but that there are lesions we ended up calling little spots in the bones and that these need to be further followed up on; we reviewed the upcoming port placement, echocardiogram, and need for chemotherapy school. After all that we decided to every 14 would be the starting day for her chemotherapy.  We again reviewed the side effects, toxicities and complications of both the chemotherapy agents and the anti-HER-2 agents. She is going to see me the first day of treatment to clear up any remaining questions at that point I will set her up for her additional follow-up visits and treatments.  She knows to call for any problems that may develop before her next visit here.  Chauncey Cruel, MD   03/27/2016 8:31 AM Medical Oncology and Hematology [facility] [address] [place], [place] [address] Tel. [phone]    Fax. [phone]

## 2016-03-29 ENCOUNTER — Telehealth: Payer: Self-pay | Admitting: *Deleted

## 2016-03-29 NOTE — Telephone Encounter (Signed)
This RN spoke with pt per her call stating " I am not ready to start chemo "  Jacqueline Avery states ongoing chest discomfort and " very tired "  Per above - pain is relieved with use of medications.  She is not short of breath - just fatigued.  This RN informed pt need to see her as as scheduled on 2/20 to evaluate for any concerns and plan chemo from then.  This RN also informed pt if symptoms worsen she is to call the on call service and or may need to proceed to the ER.  Jacqueline Avery verbalized understanding.

## 2016-04-01 ENCOUNTER — Telehealth: Payer: Self-pay | Admitting: Oncology

## 2016-04-01 NOTE — Telephone Encounter (Signed)
Pt called to cxl 2/20 appt. Pt will r/s at later date

## 2016-04-02 ENCOUNTER — Ambulatory Visit: Payer: BLUE CROSS/BLUE SHIELD | Admitting: Adult Health

## 2016-04-02 ENCOUNTER — Other Ambulatory Visit: Payer: BLUE CROSS/BLUE SHIELD

## 2016-04-03 ENCOUNTER — Telehealth: Payer: Self-pay | Admitting: *Deleted

## 2016-04-03 NOTE — Telephone Encounter (Signed)
Called pt to assess needs. Relate still in pain. Discussed with pt the importance for her to f/u with Dr. Jana Hakim so he can assess and treat if needed. Received verbal understanding. Request pt to call when she feels like she will be able to f/u with Dr. Jana Hakim. Pt relate she will call when she feels she is able to come in for f/u. Encourage pt to call with needs.

## 2016-04-05 ENCOUNTER — Encounter: Payer: Self-pay | Admitting: *Deleted

## 2016-04-05 NOTE — Progress Notes (Signed)
[facility] Psychosocial Distress Screening Clinical Social Work  Clinical Social Work was referred by distress screening protocol.  The patient scored a 5 on the Psychosocial Distress Thermometer which indicates moderate distress. Clinical Social Worker reviewed chart and phoned pt to assess for distress and other psychosocial needs. CSW left brief supportive message introducing self, explaining role of CSW/Support Team and encouraged pt to reach out to CSW as needed. Contact info provided.   ON[facility] DISTRESS SCREENING 03/18/2016  Screening Type Initial Screening  Distress experienced in past week (1-10) 5  Practical problem type Transportation;Insurance  Emotional problem type Depression  Physician notified of physical symptoms Yes  Referral to clinical social work Yes  Referral to financial advocate Yes    Clinical Social Worker follow up needed: No.  If yes, follow up plan:  Loren Racer, Marlinda Mike, OSW-C Clinical Social Worker [place]  [facility] Phone: [phone] Fax: [phone]

## 2016-04-08 ENCOUNTER — Telehealth: Payer: Self-pay | Admitting: *Deleted

## 2016-04-08 NOTE — Telephone Encounter (Signed)
Pt called to give an update on status. Currently in [place] with husband. Had xray on Friday that "show a lot of fluid". Relate she is "having fluid drained today".  Encourage pt to continue to update on status. Received verbal understanding.

## 2016-04-17 ENCOUNTER — Other Ambulatory Visit: Payer: BLUE CROSS/BLUE SHIELD

## 2016-04-17 ENCOUNTER — Ambulatory Visit: Payer: BLUE CROSS/BLUE SHIELD

## 2016-05-07 ENCOUNTER — Telehealth: Payer: Self-pay | Admitting: *Deleted

## 2016-05-07 NOTE — Telephone Encounter (Signed)
Called pt to assess needs. Relate doing "better". Pt states she has moved to [place] to be with her husband. Informed pt that is she moves back to [place], to please call if she needs assistance. Received verbal understanding.

## 2016-05-08 ENCOUNTER — Ambulatory Visit: Payer: BLUE CROSS/BLUE SHIELD

## 2016-05-08 ENCOUNTER — Other Ambulatory Visit: Payer: BLUE CROSS/BLUE SHIELD

## 2016-10-02 NOTE — Addendum Note (Signed)
Addendum  created 10/02/16 1145 by Albertha Ghee, MD   Sign clinical note

## 2017-04-04 IMAGING — CT CT CHEST W/ CM
2 of 5 series · 15 of 36 positions shown, 18 images · IV contrast (iopamidol)
Comparison: None.

CLINICAL DATA: Left-sided breast cancer.

EXAM:
CT CHEST WITH CONTRAST
TECHNIQUE: Multidetector CT imaging of the chest was performed during
intravenous contrast administration.
CONTRAST:  75mL 32IH4C-TAA IOPAMIDOL (32IH4C-TAA) INJECTION 61%

[Series 4: super d · axial · 0.71mm/px · z∈[-84,+167]mm · 12 of 297 slices shown, 15 images]
[im 23/297  mediastinal]
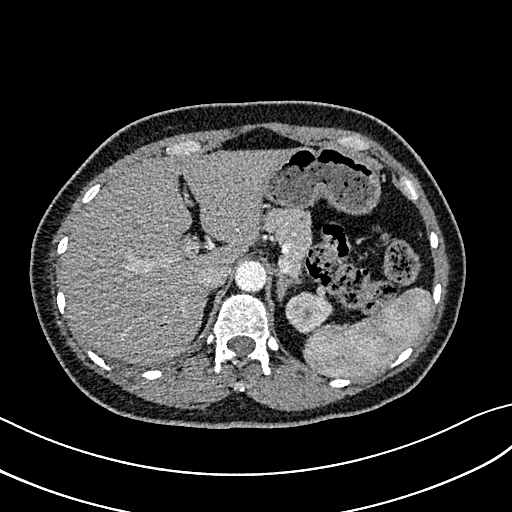
[im 23/297  lung]
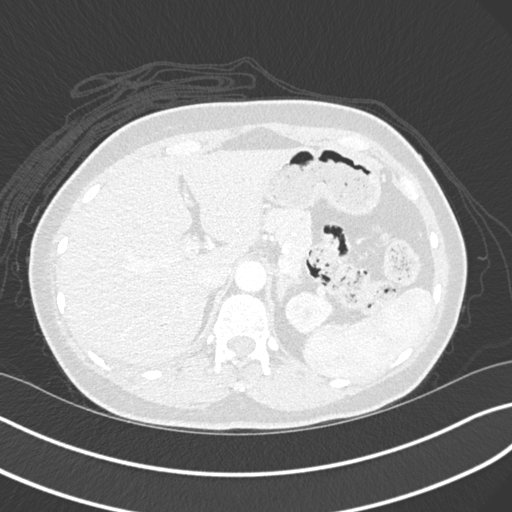
[im 46/297  lung]
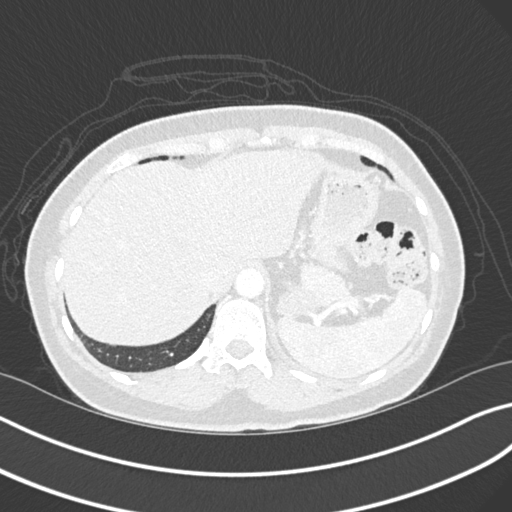
[im 69/297  lung]
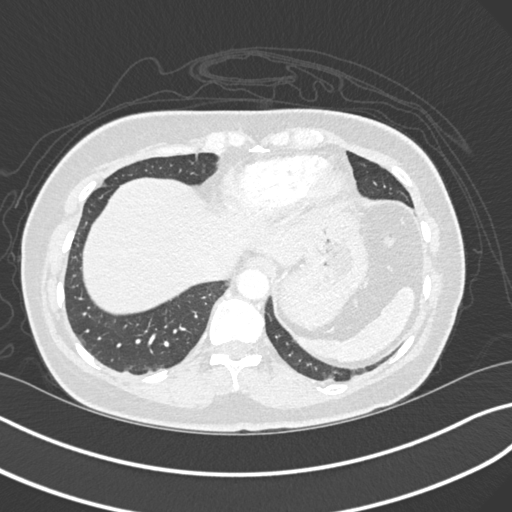
[im 92/297  lung]
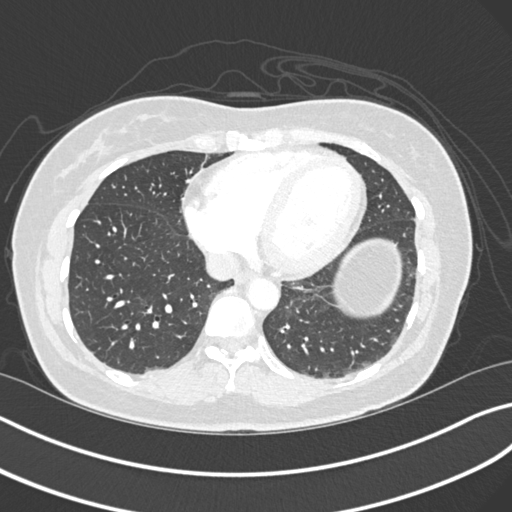
[im 114/297  mediastinal]
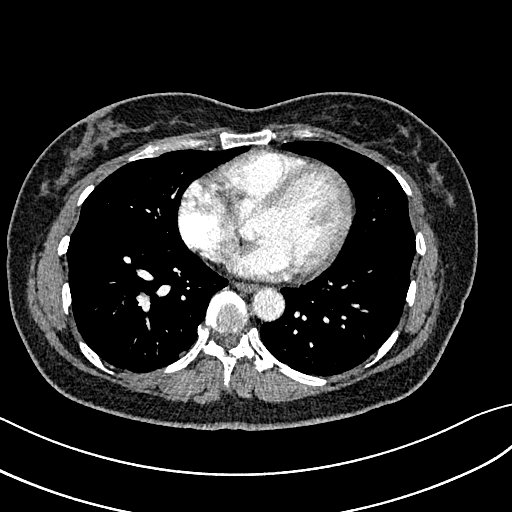
[im 114/297  lung]
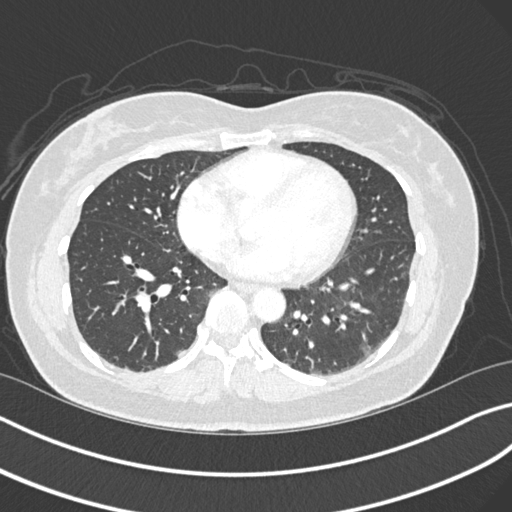
[im 137/297  lung]
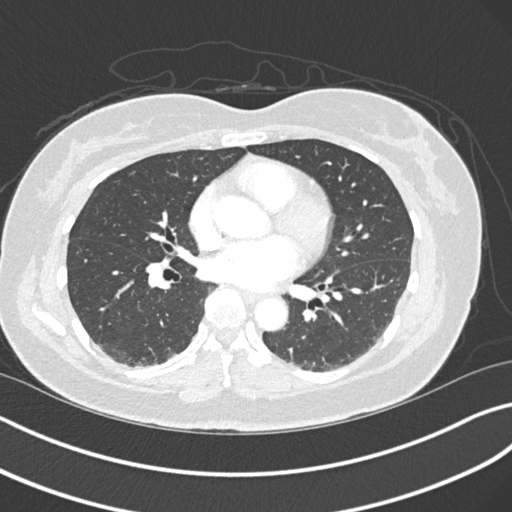
[im 160/297  lung]
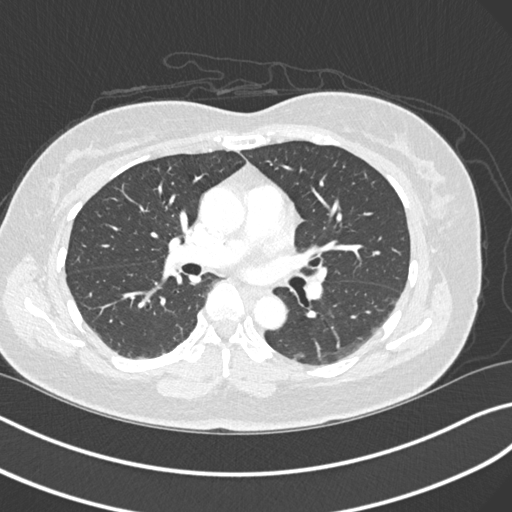
[im 183/297  lung]
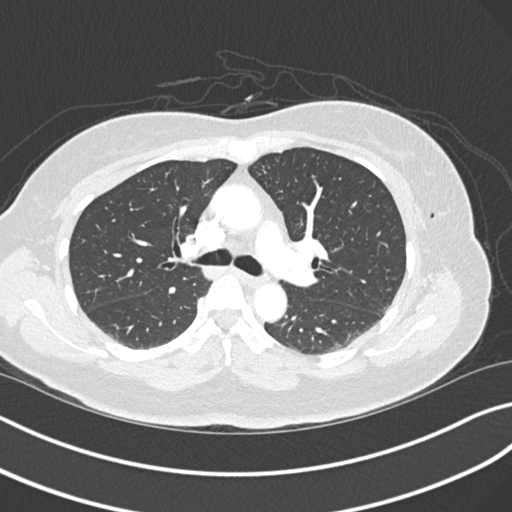
[im 205/297  mediastinal]
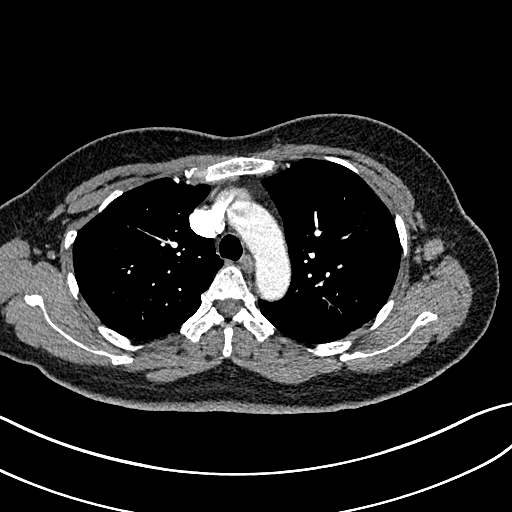
[im 205/297  lung]
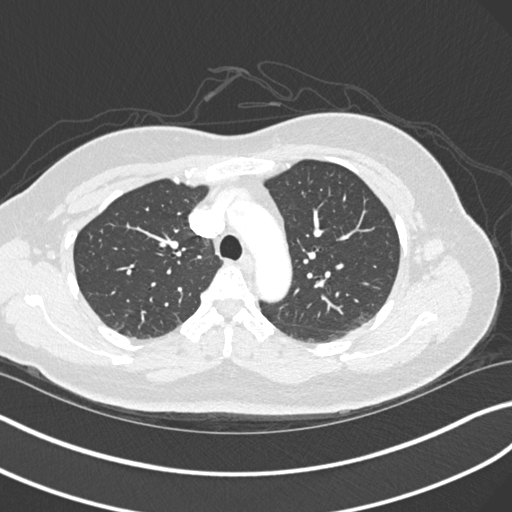
[im 228/297  lung]
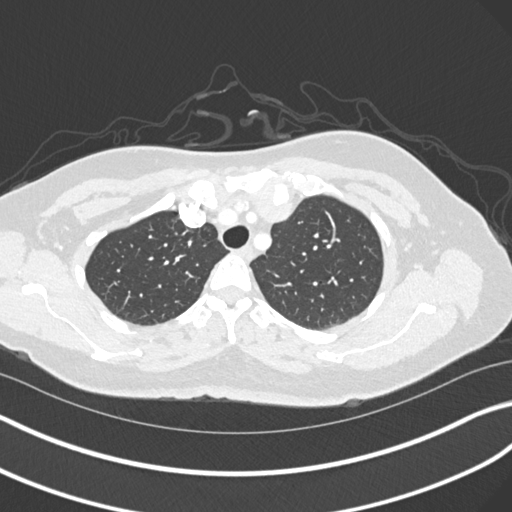
[im 251/297  lung]
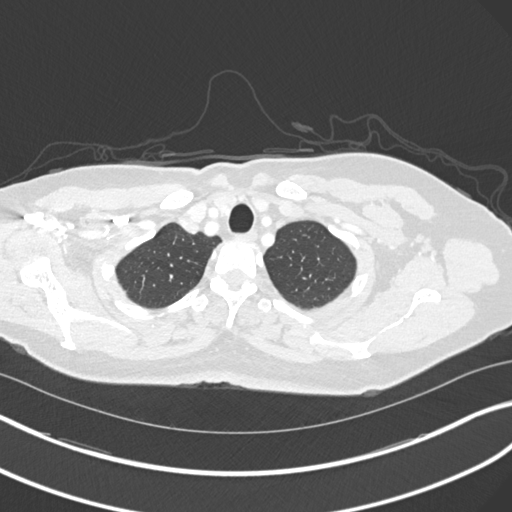
[im 274/297  lung]
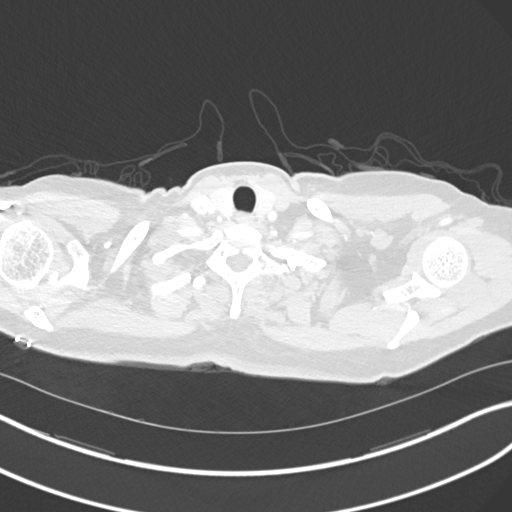

[Series 6: coronal · coronal · 0.64mm/px · 3 of 118 slices shown]
[im 24/118  lung]
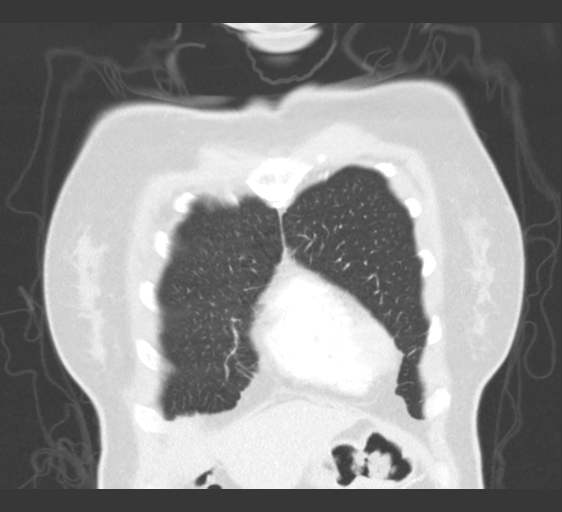
[im 47/118  lung]
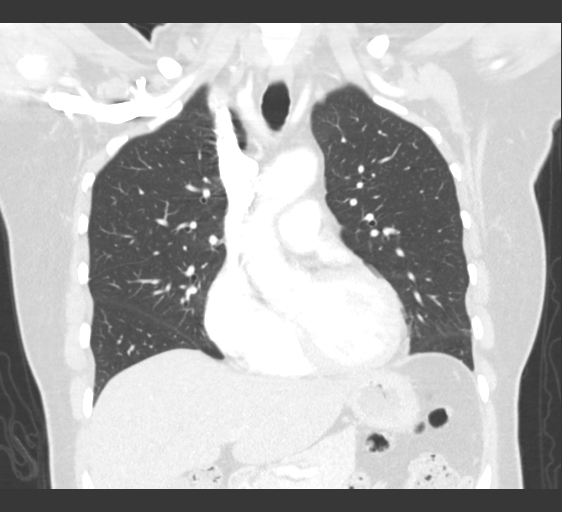
[im 71/118  lung]
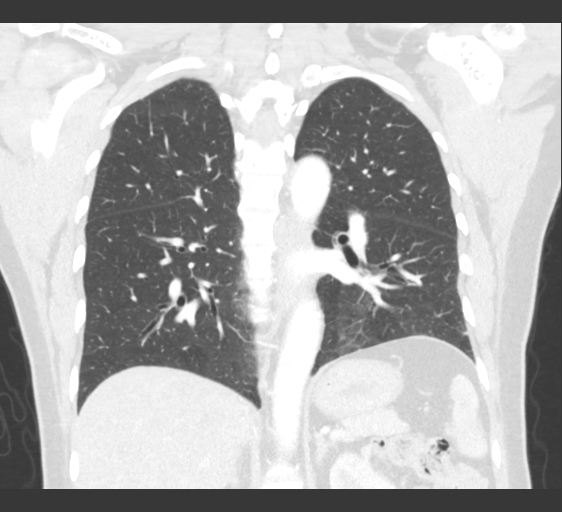

[15 of 36 positions shown; findings below may reference images not displayed]

FINDINGS: Cardiovascular: The heart size is normal. No pericardial effusion.
No thoracic aortic aneurysm.

Mediastinum/Nodes: No mediastinal lymphadenopathy. No evidence for
internal mammary lymphadenopathy. There is no hilar lymphadenopathy.
No right axillary lymphadenopathy. 13 mm left axillary lymph node is
associated with other small left axillary lymph nodes.

Lungs/Pleura: Dependent compressive atelectasis noted in the lower
lungs bilaterally. No focal airspace consolidation. No pulmonary
edema or pleural effusion.

Upper Abdomen: No enhancing lesions identified in the visualized
portion of the upper liver.

Musculoskeletal: Bone windows reveal no worrisome lytic or sclerotic
osseous lesions.
IMPRESSION: 1. Left axillary lymphadenopathy. No other lymphadenopathy or
evidence of metastatic disease in the thorax.
